# Patient Record
Sex: Male | Born: 1975 | Race: White | Hispanic: No | Marital: Married | Smoking: Former smoker
Health system: Southern US, Community
[De-identification: ages and names within clinical notes are randomized; demographics above are authoritative.]

## PROBLEM LIST (undated history)

## (undated) DIAGNOSIS — D649 Anemia, unspecified: Secondary | ICD-10-CM

## (undated) DIAGNOSIS — I10 Essential (primary) hypertension: Secondary | ICD-10-CM

## (undated) DIAGNOSIS — E785 Hyperlipidemia, unspecified: Secondary | ICD-10-CM

## (undated) DIAGNOSIS — S93409A Sprain of unspecified ligament of unspecified ankle, initial encounter: Secondary | ICD-10-CM

## (undated) HISTORY — PX: COLONOSCOPY: SHX174

## (undated) HISTORY — DX: Anemia, unspecified: D64.9

## (undated) HISTORY — PX: HERNIA REPAIR: SHX51

## (undated) HISTORY — DX: Essential (primary) hypertension: I10

## (undated) HISTORY — PX: OTHER SURGICAL HISTORY: SHX169

## (undated) HISTORY — DX: Hyperlipidemia, unspecified: E78.5

## (undated) HISTORY — DX: Sprain of unspecified ligament of unspecified ankle, initial encounter: S93.409A

---

## 2013-08-04 LAB — HM COLONOSCOPY

## 2014-08-07 ENCOUNTER — Ambulatory Visit (INDEPENDENT_AMBULATORY_CARE_PROVIDER_SITE_OTHER): Payer: 59 | Admitting: Family Medicine

## 2014-08-07 ENCOUNTER — Encounter: Payer: Self-pay | Admitting: Family Medicine

## 2014-08-07 VITALS — BP 148/92 | HR 60 | Temp 98.3°F | Resp 16 | Ht 73.75 in | Wt 217.8 lb

## 2014-08-07 DIAGNOSIS — R21 Rash and other nonspecific skin eruption: Secondary | ICD-10-CM

## 2014-08-07 DIAGNOSIS — E785 Hyperlipidemia, unspecified: Secondary | ICD-10-CM

## 2014-08-07 DIAGNOSIS — Z0000 Encounter for general adult medical examination without abnormal findings: Secondary | ICD-10-CM

## 2014-08-07 DIAGNOSIS — IMO0001 Reserved for inherently not codable concepts without codable children: Secondary | ICD-10-CM

## 2014-08-07 DIAGNOSIS — D649 Anemia, unspecified: Secondary | ICD-10-CM | POA: Diagnosis not present

## 2014-08-07 DIAGNOSIS — R030 Elevated blood-pressure reading, without diagnosis of hypertension: Secondary | ICD-10-CM | POA: Diagnosis not present

## 2014-08-07 MED ORDER — ATORVASTATIN CALCIUM 20 MG PO TABS: 20.0000 mg | ORAL_TABLET | Freq: Every day | ORAL | Status: DC

## 2014-08-07 MED ORDER — TRIAMCINOLONE ACETONIDE 0.1 % EX CREA: 1.0000 "application " | TOPICAL_CREAM | Freq: Two times a day (BID) | CUTANEOUS | Status: DC

## 2014-08-07 NOTE — Progress Notes (Signed)
   Subjective:    Patient ID: Cory Mcfarland, male    DOB: [birth year], 39 y.o.   MRN: [id number]  HPI    Review of Systems  Constitutional: Negative.   HENT: Negative.   Eyes: Negative.   Respiratory: Negative.   Cardiovascular: Negative.   Gastrointestinal: Negative.   Endocrine: Negative.   Genitourinary: Negative.   Musculoskeletal: Negative.   Skin: Positive for rash.  Allergic/Immunologic: Negative.   Neurological: Negative.   Hematological: Negative.   Psychiatric/Behavioral: Negative.        Objective:   Physical Exam        Assessment & Plan:

## 2014-08-07 NOTE — Progress Notes (Signed)
Subjective:    Patient ID: Cory Mcfarland, male    DOB: 07-01-1975, 39 y.o.   MRN: [id number] This chart was scribed for Meredith Staggers, MD by Littie Deeds, Medical Scribe. This patient was seen in Room 23 and the patient's care was started at 2:22 PM.    HPI HPI Comments: Cory Mcfarland is a 39 y.o. male with a history of hyperlipidemia who presents to the [facility] for a complete physical exam. He also has concern for a rash on the back of his head. He is a new patient to me. Takes Lipitor for hyperlipidemia.   Patient recently moved to the area from [place] about 6 months ago. He grew up in [place], [place]. He works for an Scientist, [employer].   Anemia: About 1 year ago, before coming here, he had been found to have anemia. He had some testing done which includes colonoscopy, endoscopy and PillCam capsule endoscopy. Patient was told to follow-up in 10 years. He was found to have chronic hemorrhoid for which he had a hemorrhoidectomy October of last year. He moved here about 2 weeks after the surgery. He has not had his blood count checked since then. He denies blood in stool. Patient states he has never felt any symptoms of anemia.  Rash: Patient has had a rash to the back of his scalp that was first noticed 2-3 weeks ago. He has tried Neutrogena T/Gel with some relief. He states he has had a similar rash to the hairline of his neck previously.  Elevated Blood Pressure: Patient does have elevated blood pressure in the office today at 148/92. He states his blood pressure tends to fluctuate and does not have concern for his blood pressure. Patient does not check his blood pressure at home. He denies history of hypertension.  Hyperlipidemia: Patient was diagnosed with hyperlipidemia about 4.5 years ago. He was initially started on 10 mg Lipitor and is now on 20 mg Lipitor. Patient smoked about 1 pack a week for 5-6 years, but quit smoking 12-13 years ago. FMHx includes  hyperlipidemia in his mother. His father died at age 35 for cancer that started in the kidneys and was a smoker.   Immunizations: Last Tdap is unknown. He will check with his previous doctor in [place] about his tetanus status. He does not typically get the flu shot and has had the flu shot only once.  There is no immunization history on file for this patient.   Depression Screening: Depression screen [facility] 2/9 08/07/2014 08/07/2014  Decreased Interest 0 0  Down, Depressed, Hopeless 0 0  PHQ - 2 Score 0 0   Exercise: Patient exercises 5 days a week. He denies lightheadedness and dizziness.  Dentist: Patient states he sees a Education officer, community regularly.  Vision: Vision testing was done without glasses, which he normally wears. He last had his vision tested 1 year ago.  Visual Acuity Screening   Right eye Left eye Both eyes  Without correction: 20/40 20/40 20/40   With correction:       STI Testing: Patient is married and has two girls of age 23 and 68. He does not need STI testing.  Review of Systems 13 point ROS reviewed on patient health survey. Negative other than listed above or in nursing note. See nursing note.      Objective:   Physical Exam  Constitutional: He is oriented to person, place, and time. He appears well-developed and well-nourished. No distress.  HENT:  Head: Normocephalic  and atraumatic.  Mouth/Throat: Oropharynx is clear and moist. No oropharyngeal exudate.  Eyes: Pupils are equal, round, and reactive to light.  Neck: Neck supple.  Cardiovascular: Normal rate.   Pulmonary/Chest: Effort normal.  Musculoskeletal: He exhibits no edema.  Neurological: He is alert and oriented to person, place, and time. No cranial nerve deficit.  Skin: Skin is warm and dry. Rash noted.  Multiple areas of erythema to the base of the hair follicles of the occiput of scalp which spreads posteriorly to the lower hairline of the posterior neck, few excoriated areas.  Psychiatric: He has a normal  mood and affect. His behavior is normal.  Vitals reviewed.    Filed Vitals:   08/07/14 1336  BP: 148/92  Pulse: 60  Temp: 98.3 F (36.8 C)  TempSrc: Oral  Resp: 16  Height: 6' 1.75" (1.873 m)  Weight: 217 lb 12.8 oz (98.793 kg)  SpO2: 96%       Assessment & Plan:   Cory Mcfarland is a 39 y.o. male Annual physical exam  --anticipatory guidance as below in AVS, screening labs above. Health maintenance items as above in HPI discussed/recommended as applicable.   Hyperlipidemia - Plan: atorvastatin (LIPITOR) 20 MG tablet, COMPLETE METABOLIC PANEL WITH GFR, Lipid panel  - Controlled by history.  Continue Lipitor 20 mg daily, lab orders for fasting labs in next week or 2, with CMP and lipid panel  Anemia, unspecified anemia type - Plan: CBC with Differential/Platelet  -By history, with normal GI workup including endoscopy, colonoscopy, and capsule eval.   -Repeat CBC.  Elevated BP  -Borderline elevated in office, will have him check his BP outside of office, and if over 140/90, return for recheck and possible treatment.  Rash and nonspecific skin eruption - Plan: triamcinolone cream (KENALOG) 0.1 %, Ambulatory referral to Dermatology  -Upper, posterior scalp.  Does not appear to be tinea.  DDX of seborrheic dermatitis, contact dermatitis.  Can try Kenalog to affected areas short-term if needed, but will refer to dermatology for eval, return to clinic precautions.   Meds ordered this encounter  Medications  . DISCONTD: atorvastatin (LIPITOR) 20 MG tablet    Sig: Take 20 mg by mouth daily.  Marland Kitchen. atorvastatin (LIPITOR) 20 MG tablet    Sig: Take 1 tablet (20 mg total) by mouth daily.    Dispense:  90 tablet    Refill:  1  . triamcinolone cream (KENALOG) 0.1 %    Sig: Apply 1 application topically 2 (two) times daily. As needed to affected areas.    Dispense:  30 g    Refill:  0   Patient Instructions  Keep a record of your blood pressures outside of the office and bring them to  the next office visit - if over 140/90 - return to discuss further.   Return for fasting blood work in next week. You should receive a call or letter about your lab results within the next week to 10 days.    I will refer you to dermatology.  You can try steroid cream to itching areas up to twice per day as needed for now.   Keeping you healthy  Get these tests  Blood pressure- Have your blood pressure checked once a year by your healthcare provider.  Normal blood pressure is 120/80.  Weight- Have your body mass index (BMI) calculated to screen for obesity.  BMI is a measure of body fat based on height and weight. You can also calculate your own BMI  at https://www.west-esparza.com/.  Cholesterol- Have your cholesterol checked regularly starting at age 84, sooner may be necessary if you have diabetes, high blood pressure, if a family member developed heart diseases at an early age or if you smoke.   Chlamydia, HIV, and other sexual transmitted disease- Get screened each year until the age of 86 then within three months of each new sexual partner.  Diabetes- Have your blood sugar checked regularly if you have high blood pressure, high cholesterol, a family history of diabetes or if you are overweight.  Get these vaccines  Flu shot- Every fall.  Tetanus shot- Every 10 years.  Menactra- Single dose; prevents meningitis.  Take these steps  Don't smoke- If you do smoke, ask your healthcare provider about quitting. For tips on how to quit, go to www.smokefree.gov or call 1-800-QUIT-NOW.  Be physically active- Exercise 5 days a week for at least 30 minutes.  If you are not already physically active start slow and gradually work up to 30 minutes of moderate physical activity.  Examples of moderate activity include walking briskly, mowing the yard, dancing, swimming bicycling, etc.  Eat a healthy diet- Eat a variety of healthy foods such as fruits, vegetables, low fat milk, low fat cheese,  yogurt, lean meats, poultry, fish, beans, tofu, etc.  For more information on healthy eating, go to www.thenutritionsource.org  Drink alcohol in moderation- Limit alcohol intake two drinks or less a day.  Never drink and drive.  Dentist- Brush and floss teeth twice daily; visit your dentis twice a year.  Depression-Your emotional health is as important as your physical health.  If you're feeling down, losing interest in things you normally enjoy please talk with your healthcare provider.  Gun Safety- If you keep a gun in your home, keep it unloaded and with the safety lock on.  Bullets should be stored separately.  Helmet use- Always wear a helmet when riding a motorcycle, bicycle, rollerblading or skateboarding.  Safe sex- If you may be exposed to a sexually transmitted infection, use a condom  Seat belts- Seat bels can save your life; always wear one.  Smoke/Carbon Monoxide detectors- These detectors need to be installed on the appropriate level of your home.  Replace batteries at least once a year.  Skin Cancer- When out in the sun, cover up and use sunscreen SPF 15 or higher.  Violence- If anyone is threatening or hurting you, please tell your healthcare provider.    I personally performed the services described in this documentation, which was scribed in my presence. The recorded information has been reviewed and considered, and addended by me as needed.

## 2014-08-07 NOTE — Patient Instructions (Addendum)
Keep a record of your blood pressures outside of the office and bring them to the next office visit - if over 140/90 - return to discuss further.   Return for fasting blood work in next week. You should receive a call or letter about your lab results within the next week to 10 days.    I will refer you to dermatology.  You can try steroid cream to itching areas up to twice per day as needed for now.   Keeping you healthy  Get these tests  Blood pressure- Have your blood pressure checked once a year by your healthcare provider.  Normal blood pressure is 120/80.  Weight- Have your body mass index (BMI) calculated to screen for obesity.  BMI is a measure of body fat based on height and weight. You can also calculate your own BMI at https://www.west-esparza.com/www.nhlbisupport.com/bmi/.  Cholesterol- Have your cholesterol checked regularly starting at age 39, sooner may be necessary if you have diabetes, high blood pressure, if a family member developed heart diseases at an early age or if you smoke.   Chlamydia, HIV, and other sexual transmitted disease- Get screened each year until the age of 39 then within three months of each new sexual partner.  Diabetes- Have your blood sugar checked regularly if you have high blood pressure, high cholesterol, a family history of diabetes or if you are overweight.  Get these vaccines  Flu shot- Every fall.  Tetanus shot- Every 10 years.  Menactra- Single dose; prevents meningitis.  Take these steps  Don't smoke- If you do smoke, ask your healthcare provider about quitting. For tips on how to quit, go to www.smokefree.gov or call 1-800-QUIT-NOW.  Be physically active- Exercise 5 days a week for at least 30 minutes.  If you are not already physically active start slow and gradually work up to 30 minutes of moderate physical activity.  Examples of moderate activity include walking briskly, mowing the yard, dancing, swimming bicycling, etc.  Eat a healthy diet- Eat a variety of  healthy foods such as fruits, vegetables, low fat milk, low fat cheese, yogurt, lean meats, poultry, fish, beans, tofu, etc.  For more information on healthy eating, go to www.thenutritionsource.org  Drink alcohol in moderation- Limit alcohol intake two drinks or less a day.  Never drink and drive.  Dentist- Brush and floss teeth twice daily; visit your dentis twice a year.  Depression-Your emotional health is as important as your physical health.  If you're feeling down, losing interest in things you normally enjoy please talk with your healthcare provider.  Gun Safety- If you keep a gun in your home, keep it unloaded and with the safety lock on.  Bullets should be stored separately.  Helmet use- Always wear a helmet when riding a motorcycle, bicycle, rollerblading or skateboarding.  Safe sex- If you may be exposed to a sexually transmitted infection, use a condom  Seat belts- Seat bels can save your life; always wear one.  Smoke/Carbon Monoxide detectors- These detectors need to be installed on the appropriate level of your home.  Replace batteries at least once a year.  Skin Cancer- When out in the sun, cover up and use sunscreen SPF 15 or higher.  Violence- If anyone is threatening or hurting you, please tell your healthcare provider.

## 2014-08-15 ENCOUNTER — Other Ambulatory Visit (INDEPENDENT_AMBULATORY_CARE_PROVIDER_SITE_OTHER): Payer: 59

## 2014-08-15 DIAGNOSIS — E785 Hyperlipidemia, unspecified: Secondary | ICD-10-CM

## 2014-08-15 DIAGNOSIS — D649 Anemia, unspecified: Secondary | ICD-10-CM | POA: Diagnosis not present

## 2014-08-15 LAB — CBC WITH DIFFERENTIAL/PLATELET
Basophils Absolute: 0 10*3/uL (ref 0.0–0.1)
Basophils Relative: 0 % (ref 0–1)
EOS PCT: 6 % — AB (ref 0–5)
Eosinophils Absolute: 0.2 10*3/uL (ref 0.0–0.7)
HCT: 42 % (ref 39.0–52.0)
HEMOGLOBIN: 13.7 g/dL (ref 13.0–17.0)
LYMPHS ABS: 1.2 10*3/uL (ref 0.7–4.0)
LYMPHS PCT: 35 % (ref 12–46)
MCH: 25.9 pg — ABNORMAL LOW (ref 26.0–34.0)
MCHC: 32.6 g/dL (ref 30.0–36.0)
MCV: 79.4 fL (ref 78.0–100.0)
MPV: 10.4 fL (ref 8.6–12.4)
Monocytes Absolute: 0.4 10*3/uL (ref 0.1–1.0)
Monocytes Relative: 12 % (ref 3–12)
NEUTROS ABS: 1.6 10*3/uL — AB (ref 1.7–7.7)
Neutrophils Relative %: 47 % (ref 43–77)
PLATELETS: 246 10*3/uL (ref 150–400)
RBC: 5.29 MIL/uL (ref 4.22–5.81)
RDW: 14.3 % (ref 11.5–15.5)
WBC: 3.4 10*3/uL — ABNORMAL LOW (ref 4.0–10.5)

## 2014-08-15 LAB — COMPLETE METABOLIC PANEL WITH GFR
ALBUMIN: 4.2 g/dL (ref 3.5–5.2)
ALT: 22 U/L (ref 0–53)
AST: 20 U/L (ref 0–37)
Alkaline Phosphatase: 46 U/L (ref 39–117)
BUN: 14 mg/dL (ref 6–23)
CHLORIDE: 106 meq/L (ref 96–112)
CO2: 28 mEq/L (ref 19–32)
Calcium: 9.7 mg/dL (ref 8.4–10.5)
Creat: 0.83 mg/dL (ref 0.50–1.35)
GFR, Est Non African American: >89 mL/min
Glucose, Bld: 95 mg/dL (ref 70–99)
Potassium: 4.4 mEq/L (ref 3.5–5.3)
Sodium: 139 mEq/L (ref 135–145)
TOTAL PROTEIN: 7.3 g/dL (ref 6.0–8.3)
Total Bilirubin: 0.6 mg/dL (ref 0.2–1.2)

## 2014-08-15 LAB — LIPID PANEL
CHOL/HDL RATIO: 4.3 ratio
Cholesterol: 162 mg/dL (ref 0–200)
HDL: 38 mg/dL — ABNORMAL LOW (ref >=40)
LDL CALC: 84 mg/dL (ref 0–99)
Triglycerides: 202 mg/dL — ABNORMAL HIGH (ref <150)
VLDL: 40 mg/dL (ref 0–40)

## 2014-08-15 NOTE — Progress Notes (Signed)
Pt is here for lab work only. 

## 2015-02-05 ENCOUNTER — Ambulatory Visit: Payer: 59 | Admitting: Family Medicine

## 2015-03-29 ENCOUNTER — Telehealth: Payer: Self-pay

## 2015-03-29 DIAGNOSIS — E785 Hyperlipidemia, unspecified: Secondary | ICD-10-CM

## 2015-03-29 DIAGNOSIS — D649 Anemia, unspecified: Secondary | ICD-10-CM

## 2015-03-29 NOTE — Telephone Encounter (Signed)
Pt missed his 6 month follow up appointment 1/17. He is scheduled for another appt 04/11/15 but he wants to go ahead and have the lab work done ahead of time, like coming into the walk in clinic for that. He states that he prefers Dr. Neva Seat have his lab work back before the appointment 3/15 that's why he doesn't want to have them done on the day of his scheduled appointment. So if there needs to be adjustments to his medication it can be done at that time. Pt would like an order place for labs to be done please. Call patient with details.

## 2015-03-29 NOTE — Telephone Encounter (Deleted)
Pt missed his 6 month follow up appt. He wants to come in and have labs done but isd s

## 2015-03-30 NOTE — Telephone Encounter (Signed)
Future lab orders placed. Should be fasting for these. Can have done prior to his visit. Let me know if other questions.

## 2015-04-05 ENCOUNTER — Other Ambulatory Visit (INDEPENDENT_AMBULATORY_CARE_PROVIDER_SITE_OTHER): Payer: Managed Care, Other (non HMO)

## 2015-04-05 DIAGNOSIS — E785 Hyperlipidemia, unspecified: Secondary | ICD-10-CM | POA: Diagnosis not present

## 2015-04-05 DIAGNOSIS — D649 Anemia, unspecified: Secondary | ICD-10-CM

## 2015-04-05 LAB — COMPLETE METABOLIC PANEL WITH GFR
ALT: 37 U/L (ref 9–46)
AST: 24 U/L (ref 10–40)
Albumin: 4.3 g/dL (ref 3.6–5.1)
Alkaline Phosphatase: 41 U/L (ref 40–115)
BILIRUBIN TOTAL: 0.5 mg/dL (ref 0.2–1.2)
BUN: 12 mg/dL (ref 7–25)
CHLORIDE: 106 mmol/L (ref 98–110)
CO2: 24 mmol/L (ref 20–31)
Calcium: 9.4 mg/dL (ref 8.6–10.3)
Creat: 0.94 mg/dL (ref 0.60–1.35)
GLUCOSE: 94 mg/dL (ref 65–99)
Potassium: 4.8 mmol/L (ref 3.5–5.3)
SODIUM: 139 mmol/L (ref 135–146)
TOTAL PROTEIN: 7.2 g/dL (ref 6.1–8.1)

## 2015-04-05 LAB — CBC
HCT: 41.4 % (ref 39.0–52.0)
HEMOGLOBIN: 13.6 g/dL (ref 13.0–17.0)
MCH: 26.2 pg (ref 26.0–34.0)
MCHC: 32.9 g/dL (ref 30.0–36.0)
MCV: 79.6 fL (ref 78.0–100.0)
MPV: 10 fL (ref 8.6–12.4)
PLATELETS: 242 10*3/uL (ref 150–400)
RBC: 5.2 MIL/uL (ref 4.22–5.81)
RDW: 14.1 % (ref 11.5–15.5)
WBC: 4.6 10*3/uL (ref 4.0–10.5)

## 2015-04-05 LAB — LIPID PANEL
CHOL/HDL RATIO: 4.6 ratio (ref <=5.0)
Cholesterol: 173 mg/dL (ref 125–200)
HDL: 38 mg/dL — AB (ref >=40)
LDL CALC: 92 mg/dL (ref <130)
TRIGLYCERIDES: 215 mg/dL — AB (ref <150)
VLDL: 43 mg/dL — AB (ref <30)

## 2015-04-11 ENCOUNTER — Encounter: Payer: Self-pay | Admitting: Family Medicine

## 2015-04-11 ENCOUNTER — Ambulatory Visit (INDEPENDENT_AMBULATORY_CARE_PROVIDER_SITE_OTHER): Payer: Managed Care, Other (non HMO) | Admitting: Family Medicine

## 2015-04-11 VITALS — BP 130/80 | HR 67 | Temp 98.1°F | Resp 16 | Ht 73.5 in | Wt 220.0 lb

## 2015-04-11 DIAGNOSIS — R21 Rash and other nonspecific skin eruption: Secondary | ICD-10-CM | POA: Diagnosis not present

## 2015-04-11 DIAGNOSIS — E785 Hyperlipidemia, unspecified: Secondary | ICD-10-CM | POA: Diagnosis not present

## 2015-04-11 DIAGNOSIS — M79671 Pain in right foot: Secondary | ICD-10-CM

## 2015-04-11 DIAGNOSIS — M79672 Pain in left foot: Secondary | ICD-10-CM | POA: Diagnosis not present

## 2015-04-11 DIAGNOSIS — Z309 Encounter for contraceptive management, unspecified: Secondary | ICD-10-CM | POA: Diagnosis not present

## 2015-04-11 DIAGNOSIS — Z3009 Encounter for other general counseling and advice on contraception: Secondary | ICD-10-CM

## 2015-04-11 MED ORDER — ATORVASTATIN CALCIUM 20 MG PO TABS: 20.0000 mg | ORAL_TABLET | Freq: Every day | ORAL | Status: DC

## 2015-04-11 NOTE — Patient Instructions (Signed)
Blood pressure ok today. Ok to continue to monitor out of office and if remaining elevated over 140/90 - let me know.   Ok to remain on same dose of Lipitor for now, but follow up later this year for a physical. Can recheck at that time to determine if change needed.   Make sure laces and shoewear  are not too tight as this can cause symptoms on top of feet. If these symptoms worsen/return - recheck.

## 2015-04-11 NOTE — Progress Notes (Signed)
Subjective:  By signing my name below, I, Cory Mcfarland, attest that this documentation has been prepared under the direction and in the presence of Cory StaggersJeffrey Tokiko Diefenderfer, MD.  Electronically Signed: Andrew Auaven Mcfarland, ED Scribe. 04/11/2015. 2:32 PM.  Patient ID: Cory Mcfarland, male    DOB: [birth year], 40 y.o.   MRN: [id number]  HPI   Chief Complaint  Patient presents with  . Follow-up    6 mos  . elevated BP  . Hyperlipidemia  . pain on top pt both feet x 2 wks    "thinks it's from running'    HPI Comments: Cory Mcfarland Ciolino is a 40 y.o. male who presents to the [facility] for a follow up. Last visit with me 07/2014. Here with multiple concerns today.   Concern of elevated BP Pt checks his BP at his gym. He states BP fluctuates from 125/94-144/84 range.   Hyperlipidemia He is on Lipitor 20mg  qd. He denies side effects with medication.   Lab Results  Component Value Date   CHOL 173 04/05/2015   HDL 38* 04/05/2015   LDLCALC 92 04/05/2015   TRIG 215* 04/05/2015   CHOLHDL 4.6 04/05/2015   Lab Results  Component Value Date   ALT 37 04/05/2015   AST 24 04/05/2015   ALKPHOS 41 04/05/2015   BILITOT 0.5 04/05/2015   Recent lipid panel last week. Overall stable except elevated triglycerides.  Pt does not indulge in fast foods. He states him and his family have a well balance diet.  He reports family hx of HLD  Hx of anemia Normal CBC recently.   Foot pain Pt states he developed a sharp pain to the dorsum of bilateral feet that began about 2 weeks ago. pt states he had started running more. He noticed pain with putting on his shoes last week. No pain with walking or bearing weight. He denies wearing new shoes. He states pain has subsided. He denies numbness and tingling in feet.   Urology referral  Pt would like a referral to urologist for a vasectomy evaluation.     There are no active problems to display for this patient.  Past Medical History  Diagnosis Date  .  Anemia   . Hyperlipidemia    Past Surgical History  Procedure Laterality Date  . Hernia repair     No Known Allergies Prior to Admission medications   Medication Sig Start Date End Date Taking? Authorizing Provider  atorvastatin (LIPITOR) 20 MG tablet Take 1 tablet (20 mg total) by mouth daily. 08/07/14  Yes Shade FloodJeffrey R Nashaun Hillmer, MD  triamcinolone cream (KENALOG) 0.1 % Apply 1 application topically 2 (two) times daily. As needed to affected areas. 08/07/14  Yes Shade FloodJeffrey R Adreanna Fickel, MD   Social History   Social History  . Marital Status: Married    Spouse Name: N/A  . Number of Children: N/A  . Years of Education: N/A   Occupational History  . Not on file.   Social History Main Topics  . Smoking status: Former Games developermoker  . Smokeless tobacco: Not on file     Comment: quit 12 years ago  . Alcohol Use: 4.8 oz/week    8 Glasses of wine per week  . Drug Use: No  . Sexual Activity: Yes   Other Topics Concern  . Not on file   Social History Narrative  . No narrative on file    Review of Systems  Musculoskeletal: Negative for gait problem.  Neurological: Negative for weakness and  numbness.   Objective:   Physical Exam  Constitutional: He is oriented to person, place, and time. He appears well-developed and well-nourished. No distress.  HENT:  Head: Normocephalic and atraumatic.  Eyes: Conjunctivae and EOM are normal.  Neck: Neck supple.  Cardiovascular: Normal rate, regular rhythm and normal heart sounds.   No murmur heard. Pulses:      Dorsalis pedis pulses are 2+ on the right side, and 2+ on the left side.  Pulmonary/Chest: Effort normal and breath sounds normal. He has no wheezes. He has no rhonchi. He has no rales.  Musculoskeletal: Normal range of motion. He exhibits no edema.  Negative tinel's over ankle. Full ROM of ankle and feet.   Neurological: He is alert and oriented to person, place, and time.  Skin: Skin is warm and dry.  Skin is intact.   Psychiatric: He has a  normal mood and affect. His behavior is normal.  Nursing note and vitals reviewed.   Filed Vitals:   04/11/15 1413  BP: 130/80  Pulse: 67  Temp: 98.1 F (36.7 C)  TempSrc: Oral  Resp: 16  Height: 6' 1.5" (1.867 m)  Weight: 220 lb (99.791 kg)  SpO2: 98%   Wt Readings from Last 3 Encounters:  04/11/15 220 lb (99.791 kg)  08/07/14 217 lb 12.8 oz (98.793 kg)    Assessment & Plan:   Brek Reece is a 40 y.o. male Vasectomy evaluation - Plan: Ambulatory referral to Urology  - Refer to urology.  Hyperlipidemia - Plan: atorvastatin (LIPITOR) 20 MG tablet  - Elevated triglycerides, but other lipids okay. Decided to continue same dose Lipitor, follow-up for physical later this year and recheck lipids then. Watch diet, and continue exercise.  Foot pain, bilateral  -intermittent, possible extensor tendinitis versus impingement of dorsal nerve in the foot with tight shoelaces. Improved now, discussed avoiding tight laces across top of foot, and if persistent, return for recheck.  Rash and nonspecific skin eruption  -Suspected folliculitis per day, as symptoms on posterior neck after her cut specifically. Discuss course brush or washcloth after haircut and showering, Triamcinolone cream refilled if needed.  Meds ordered this encounter  Medications  . atorvastatin (LIPITOR) 20 MG tablet    Sig: Take 1 tablet (20 mg total) by mouth daily.    Dispense:  90 tablet    Refill:  2   Patient Instructions  Blood pressure ok today. Ok to continue to monitor out of office and if remaining elevated over 140/90 - let me know.   Ok to remain on same dose of Lipitor for now, but follow up later this year for a physical. Can recheck at that time to determine if change needed.   Make sure laces and shoewear  are not too tight as this can cause symptoms on top of feet. If these symptoms worsen/return - recheck.          I personally performed the services described in this documentation,  which was scribed in my presence. The recorded information has been reviewed and considered, and addended by me as needed.

## 2015-07-02 ENCOUNTER — Ambulatory Visit (INDEPENDENT_AMBULATORY_CARE_PROVIDER_SITE_OTHER): Payer: Managed Care, Other (non HMO) | Admitting: Family Medicine

## 2015-07-02 VITALS — BP 140/82 | HR 60 | Temp 97.9°F | Resp 18 | Ht 73.5 in | Wt 223.0 lb

## 2015-07-02 DIAGNOSIS — M1000 Idiopathic gout, unspecified site: Secondary | ICD-10-CM | POA: Diagnosis not present

## 2015-07-02 DIAGNOSIS — M109 Gout, unspecified: Secondary | ICD-10-CM

## 2015-07-02 MED ORDER — INDOMETHACIN 50 MG PO CAPS: 50.0000 mg | ORAL_CAPSULE | Freq: Three times a day (TID) | ORAL | Status: DC | PRN

## 2015-07-02 NOTE — Patient Instructions (Addendum)
IF you received an x-ray today, you will receive an invoice from [facility]. Please contact [facility] at [phone] with questions or concerns regarding your invoice.   IF you received labwork today, you will receive an invoice from [facility]. Please contact [facility] at [phone] with questions or concerns regarding your invoice.   Our billing staff will not be able to assist you with questions regarding bills from these companies.  You will be contacted with the lab results as soon as they are available. The fastest way to get your results is to activate your My Chart account. Instructions are located on the last page of this paperwork. If you have not heard from us regarding the results in 2 weeks, please contact this office.    Your foot pain appears to be due to gout. This is the typical way it starts. As this is the first episode, can treat with indomethacin up to 3 times per day as needed. Make sure you take this with food. Do not combine with other NSAIDs. If symptoms return, would do other testing such as a uric acid level and can discuss other medications for treatment.  Return to the clinic or go to the nearest emergency room if any of your symptoms worsen or new symptoms occur.  Gout Gout is an inflammatory arthritis caused by a buildup of uric acid crystals in the joints. Uric acid is a chemical that is normally present in the blood. When the level of uric acid in the blood is too high it can form crystals that deposit in your joints and tissues. This causes joint redness, soreness, and swelling (inflammation). Repeat attacks are common. Over time, uric acid crystals can form into masses (tophi) near a joint, destroying bone and causing disfigurement. Gout is treatable and often preventable. CAUSES  The disease begins with elevated levels of uric acid in the blood. Uric acid is produced by your body when it breaks down a  naturally found substance called purines. Certain foods you eat, such as meats and fish, contain high amounts of purines. Causes of an elevated uric acid level include:  Being passed down from parent to child (heredity).  Diseases that cause increased uric acid production (such as obesity, psoriasis, and certain cancers).  Excessive alcohol use.  Diet, especially diets rich in meat and seafood.  Medicines, including certain cancer-fighting medicines (chemotherapy), water pills (diuretics), and aspirin.  Chronic kidney disease. The kidneys are no longer able to remove uric acid well.  Problems with metabolism. Conditions strongly associated with gout include:  Obesity.  High blood pressure.  High cholesterol.  Diabetes. Not everyone with elevated uric acid levels gets gout. It is not understood why some people get gout and others do not. Surgery, joint injury, and eating too much of certain foods are some of the factors that can lead to gout attacks. SYMPTOMS   An attack of gout comes on quickly. It causes intense pain with redness, swelling, and warmth in a joint.  Fever can occur.  Often, only one joint is involved. Certain joints are more commonly involved:  Base of the big toe.  Knee.  Ankle.  Wrist.  Finger. Without treatment, an attack usually goes away in a few days to weeks. Between attacks, you usually will not have symptoms, which is different from many other forms of arthritis. DIAGNOSIS  Your caregiver will suspect gout based on your symptoms and exam. In some cases, tests may be recommended.  The tests may include:  Blood tests.  Urine tests.  X-rays.  Joint fluid exam. This exam requires a needle to remove fluid from the joint (arthrocentesis). Using a microscope, gout is confirmed when uric acid crystals are seen in the joint fluid. TREATMENT  There are two phases to gout treatment: treating the sudden onset (acute) attack and preventing attacks  (prophylaxis).  Treatment of an Acute Attack.  Medicines are used. These include anti-inflammatory medicines or steroid medicines.  An injection of steroid medicine into the affected joint is sometimes necessary.  The painful joint is rested. Movement can worsen the arthritis.  You may use warm or cold treatments on painful joints, depending which works best for you.  Treatment to Prevent Attacks.  If you suffer from frequent gout attacks, your caregiver may advise preventive medicine. These medicines are started after the acute attack subsides. These medicines either help your kidneys eliminate uric acid from your body or decrease your uric acid production. You may need to stay on these medicines for a very long time.  The early phase of treatment with preventive medicine can be associated with an increase in acute gout attacks. For this reason, during the first few months of treatment, your caregiver may also advise you to take medicines usually used for acute gout treatment. Be sure you understand your caregiver's directions. Your caregiver may make several adjustments to your medicine dose before these medicines are effective.  Discuss dietary treatment with your caregiver or dietitian. Alcohol and drinks high in sugar and fructose and foods such as meat, poultry, and seafood can increase uric acid levels. Your caregiver or dietitian can advise you on drinks and foods that should be limited. HOME CARE INSTRUCTIONS   Do not take aspirin to relieve pain. This raises uric acid levels.  Only take over-the-counter or prescription medicines for pain, discomfort, or fever as directed by your caregiver.  Rest the joint as much as possible. When in bed, keep sheets and blankets off painful areas.  Keep the affected joint raised (elevated).  Apply warm or cold treatments to painful joints. Use of warm or cold treatments depends on which works best for you.  Use crutches if the painful joint  is in your leg.  Drink enough fluids to keep your urine clear or pale yellow. This helps your body get rid of uric acid. Limit alcohol, sugary drinks, and fructose drinks.  Follow your dietary instructions. Pay careful attention to the amount of protein you eat. Your daily diet should emphasize fruits, vegetables, whole grains, and fat-free or low-fat milk products. Discuss the use of coffee, vitamin C, and cherries with your caregiver or dietitian. These may be helpful in lowering uric acid levels.  Maintain a healthy body weight. SEEK MEDICAL CARE IF:   You develop diarrhea, vomiting, or any side effects from medicines.  You do not feel better in 24 hours, or you are getting worse. SEEK IMMEDIATE MEDICAL CARE IF:   Your joint becomes suddenly more tender, and you have chills or a fever. MAKE SURE YOU:   Understand these instructions.  Will watch your condition.  Will get help right away if you are not doing well or get worse.   This information is not intended to replace advice given to you by your health care provider. Make sure you discuss any questions you have with your health care provider.   Document Released: 01/11/2000 Document Revised: 02/03/2014 Document Reviewed: 08/27/2011 Elsevier Interactive Patient Education [employer]

## 2015-07-02 NOTE — Progress Notes (Signed)
Subjective:  By signing my name below, I, [name], attest that this documentation has been prepared under the direction and in the presence of Meredith Staggers, MD.  Electronically Signed: Andrew Au, ED Scribe. 07/02/2015. 2:11 PM.   Patient ID: Cory Mcfarland, male    DOB: 01/01/1976, 40 y.o.   MRN: [id number]  HPI Chief Complaint  Patient presents with  . Foot Pain    right foot poss gout    HPI Comments: Cory Mcfarland is a 40 y.o. male who presents to the [facility] complaining of right foot pain . Of note he had dorsal foot pain thought to be due to running at march visit. Thought to be dorsal nerve impingement. Advised to loosen laces.   Pt reports sudden right foot pain began last night at 8pm. He denies right foot injury. He locates pain to dorsum of right foot at base of 1st digit.  He reports pain with bearing weight to right foot. He has taken advil with improvement to pain. He denies hx of gout. He denies change in diet including an increase in seafood's or red meat. He does not hanging out with friends drinks alcohol occasionally. .  There are no active problems to display for this patient.  Past Medical History  Diagnosis Date  . Anemia   . Hyperlipidemia    Past Surgical History  Procedure Laterality Date  . Hernia repair     No Known Allergies Prior to Admission medications   Medication Sig Start Date End Date Taking? Authorizing Provider  atorvastatin (LIPITOR) 20 MG tablet Take 1 tablet (20 mg total) by mouth daily. 04/11/15  Yes Shade Flood, MD   Social History   Social History  . Marital Status: Married    Spouse Name: N/A  . Number of Children: N/A  . Years of Education: N/A   Occupational History  . Not on file.   Social History Main Topics  . Smoking status: Former Games developer  . Smokeless tobacco: Not on file     Comment: quit 12 years ago  . Alcohol Use: 4.8 oz/week    8 Glasses of wine per week  . Drug Use: No  . Sexual  Activity: Yes   Other Topics Concern  . Not on file   Social History Narrative   Review of Systems  Constitutional: Negative for fever and chills.  Musculoskeletal: Positive for arthralgias. Negative for myalgias and gait problem.  Skin: Positive for color change. Negative for wound.  Neurological: Negative for weakness and numbness.     Objective:   Physical Exam  Constitutional: He is oriented to person, place, and time. He appears well-developed and well-nourished. No distress.  HENT:  Head: Normocephalic and atraumatic.  Eyes: Conjunctivae and EOM are normal.  Neck: Neck supple.  Cardiovascular: Normal rate.   Pulmonary/Chest: Effort normal.  Musculoskeletal: Normal range of motion.  Right first MTP is warm with slight erythema  Great toe non tender. Remainder of foot nontender including navicula and 5th MT. Skin intact no wounds.   Neurological: He is alert and oriented to person, place, and time.  Skin: Skin is warm and dry.  Psychiatric: He has a normal mood and affect. His behavior is normal.  Nursing note and vitals reviewed.  Filed Vitals:   07/02/15 1207  BP: 140/82  Pulse: 60  Temp: 97.9 F (36.6 C)  TempSrc: Oral  Resp: 18  Height: 6' 1.5" (1.867 m)  Weight: 223 lb (101.152 kg)  SpO2: 98%    Assessment & Plan:   Cory Mcfarland is a 40 y.o. male Podagra - Plan: indomethacin (INDOCIN) 50 MG capsule  Suspected initial gout flare. No wounds, no other concerning findings on exam. As initial symptoms, and likely due to recent diet change, we'll hold off on uric acid testing or prophylactic medications for now. Can treat with indomethacin 50 mg 3 times a day with food, avoid other NSAIDs, and RTC precautions, especially if recurrence.  Meds ordered this encounter  Medications  . indomethacin (INDOCIN) 50 MG capsule    Sig: Take 1 capsule (50 mg total) by mouth 3 (three) times daily as needed. With food for gout flare    Dispense:  30 capsule    Refill:  0    Patient Instructions       IF you received an x-ray today, you will receive an invoice from [facility]. Please contact [facility] at [phone] with questions or concerns regarding your invoice.   IF you received labwork today, you will receive an invoice from [facility]. Please contact [facility] at [phone] with questions or concerns regarding your invoice.   Our billing staff will not be able to assist you with questions regarding bills from these companies.  You will be contacted with the lab results as soon as they are available. The fastest way to get your results is to activate your My Chart account. Instructions are located on the last page of this paperwork. If you have not heard from [place] regarding the results in 2 weeks, please contact this office.    Your foot pain appears to be due to gout. This is the typical way it starts. As this is the first episode, can treat with indomethacin up to 3 times per day as needed. Make sure you take this with food. Do not combine with other NSAIDs. If symptoms return, would do other testing such as a uric acid level and can discuss other medications for treatment.  Return to the clinic or go to the nearest emergency room if any of your symptoms worsen or new symptoms occur.  Gout Gout is an inflammatory arthritis caused by a buildup of uric acid crystals in the joints. Uric acid is a chemical that is normally present in the blood. When the level of uric acid in the blood is too high it can form crystals that deposit in your joints and tissues. This causes joint redness, soreness, and swelling (inflammation). Repeat attacks are common. Over time, uric acid crystals can form into masses (tophi) near a joint, destroying bone and causing disfigurement. Gout is treatable and often preventable. CAUSES  The disease begins with elevated levels of uric acid in the blood. Uric acid is produced by your body  when it breaks down a naturally found substance called purines. Certain foods you eat, such as meats and fish, contain high amounts of purines. Causes of an elevated uric acid level include:  Being passed down from parent to child (heredity).  Diseases that cause increased uric acid production (such as obesity, psoriasis, and certain cancers).  Excessive alcohol use.  Diet, especially diets rich in meat and seafood.  Medicines, including certain cancer-fighting medicines (chemotherapy), water pills (diuretics), and aspirin.  Chronic kidney disease. The kidneys are no longer able to remove uric acid well.  Problems with metabolism. Conditions strongly associated with gout include:  Obesity.  High blood pressure.  High cholesterol.  Diabetes. Not everyone with elevated uric acid levels  gets gout. It is not understood why some people get gout and others do not. Surgery, joint injury, and eating too much of certain foods are some of the factors that can lead to gout attacks. SYMPTOMS   An attack of gout comes on quickly. It causes intense pain with redness, swelling, and warmth in a joint.  Fever can occur.  Often, only one joint is involved. Certain joints are more commonly involved:  Base of the big toe.  Knee.  Ankle.  Wrist.  Finger. Without treatment, an attack usually goes away in a few days to weeks. Between attacks, you usually will not have symptoms, which is different from many other forms of arthritis. DIAGNOSIS  Your caregiver will suspect gout based on your symptoms and exam. In some cases, tests may be recommended. The tests may include:  Blood tests.  Urine tests.  X-rays.  Joint fluid exam. This exam requires a needle to remove fluid from the joint (arthrocentesis). Using a microscope, gout is confirmed when uric acid crystals are seen in the joint fluid. TREATMENT  There are two phases to gout treatment: treating the sudden onset (acute) attack and  preventing attacks (prophylaxis).  Treatment of an Acute Attack.  Medicines are used. These include anti-inflammatory medicines or steroid medicines.  An injection of steroid medicine into the affected joint is sometimes necessary.  The painful joint is rested. Movement can worsen the arthritis.  You may use warm or cold treatments on painful joints, depending which works best for you.  Treatment to Prevent Attacks.  If you suffer from frequent gout attacks, your caregiver may advise preventive medicine. These medicines are started after the acute attack subsides. These medicines either help your kidneys eliminate uric acid from your body or decrease your uric acid production. You may need to stay on these medicines for a very long time.  The early phase of treatment with preventive medicine can be associated with an increase in acute gout attacks. For this reason, during the first few months of treatment, your caregiver may also advise you to take medicines usually used for acute gout treatment. Be sure you understand your caregiver's directions. Your caregiver may make several adjustments to your medicine dose before these medicines are effective.  Discuss dietary treatment with your caregiver or dietitian. Alcohol and drinks high in sugar and fructose and foods such as meat, poultry, and seafood can increase uric acid levels. Your caregiver or dietitian can advise you on drinks and foods that should be limited. HOME CARE INSTRUCTIONS   Do not take aspirin to relieve pain. This raises uric acid levels.  Only take over-the-counter or prescription medicines for pain, discomfort, or fever as directed by your caregiver.  Rest the joint as much as possible. When in bed, keep sheets and blankets off painful areas.  Keep the affected joint raised (elevated).  Apply warm or cold treatments to painful joints. Use of warm or cold treatments depends on which works best for you.  Use crutches if  the painful joint is in your leg.  Drink enough fluids to keep your urine clear or pale yellow. This helps your body get rid of uric acid. Limit alcohol, sugary drinks, and fructose drinks.  Follow your dietary instructions. Pay careful attention to the amount of protein you eat. Your daily diet should emphasize fruits, vegetables, whole grains, and fat-free or low-fat milk products. Discuss the use of coffee, vitamin C, and cherries with your caregiver or dietitian. These may be helpful  in lowering uric acid levels.  Maintain a healthy body weight. SEEK MEDICAL CARE IF:   You develop diarrhea, vomiting, or any side effects from medicines.  You do not feel better in 24 hours, or you are getting worse. SEEK IMMEDIATE MEDICAL CARE IF:   Your joint becomes suddenly more tender, and you have chills or a fever. MAKE SURE YOU:   Understand these instructions.  Will watch your condition.  Will get help right away if you are not doing well or get worse.   This information is not intended to replace advice given to you by your health care provider. Make sure you discuss any questions you have with your health care provider.   Document Released: 01/11/2000 Document Revised: 02/03/2014 Document Reviewed: 08/27/2011 Elsevier Interactive Patient Education [employer]     I personally performed the services described in this documentation, which was scribed in my presence. The recorded information has been reviewed and considered, and addended by me as needed.   Signed,   Meredith StaggersJeffrey Willian Donson, MD [facility].  07/02/2015 2:33 PM

## 2015-12-18 ENCOUNTER — Ambulatory Visit: Payer: Managed Care, Other (non HMO)

## 2015-12-19 ENCOUNTER — Other Ambulatory Visit: Payer: Self-pay | Admitting: Family Medicine

## 2015-12-19 DIAGNOSIS — E785 Hyperlipidemia, unspecified: Secondary | ICD-10-CM

## 2015-12-22 NOTE — Telephone Encounter (Signed)
06/2015 last ov 03/2015 last labs

## 2016-01-09 ENCOUNTER — Ambulatory Visit (INDEPENDENT_AMBULATORY_CARE_PROVIDER_SITE_OTHER): Payer: Managed Care, Other (non HMO) | Admitting: Family Medicine

## 2016-01-09 ENCOUNTER — Ambulatory Visit (INDEPENDENT_AMBULATORY_CARE_PROVIDER_SITE_OTHER): Payer: Managed Care, Other (non HMO)

## 2016-01-09 VITALS — BP 140/82 | HR 70 | Temp 97.9°F | Resp 18 | Ht 73.5 in | Wt 226.0 lb

## 2016-01-09 DIAGNOSIS — M79644 Pain in right finger(s): Secondary | ICD-10-CM | POA: Diagnosis not present

## 2016-01-09 MED ORDER — NAPROXEN 500 MG PO TABS: 500.0000 mg | ORAL_TABLET | Freq: Two times a day (BID) | ORAL | 0 refills | Status: DC

## 2016-01-09 NOTE — Progress Notes (Signed)
Patient ID: Cory Mcfarland, male    DOB: [birth year], 40 y.o.   MRN: [id number]  PCP: No primary care provider on file.  Chief Complaint  Patient presents with  . Hand Injury    thumb injury playing basketball    Subjective:   HPI 40 year old male presents for evaluation of right thumb pain x 1.5 months. [facility] is [facility] to [facility]. Reports that he was playing basketball and the ball bounced back and hit his thumb and suffered immediate pain and swelling.  Following the swelling, his left thumb along with the inside portion of his hand distal to his left thumb became very ecchymotic. He iced the thumb and hand. Took ibuprofen, pain subsided although never completely resolved. He has been busy with work and travel, therefore he is just following up today to have thumb evaluated. Pt reports gripping motions hurt the most.  Social History   Social History  . Marital status: Married    Spouse name: N/A  . Number of children: N/A  . Years of education: N/A   Occupational History  . Not on file.   Social History Main Topics  . Smoking status: Former Games developermoker  . Smokeless tobacco: Never Used     Comment: quit 12 years ago  . Alcohol use 4.8 oz/week    8 Glasses of wine per week  . Drug use: No  . Sexual activity: Yes   Other Topics Concern  . Not on file   Social History Narrative  . No narrative on file    Family History  Problem Relation Age of Onset  . Hyperlipidemia Mother   . Cancer Father   . Cancer Maternal Grandmother   . Cancer Maternal Grandfather   . Diabetes Paternal Grandfather    Review of Systems See HPI  There are no active problems to display for this patient.  Prior to Admission medications   Medication Sig Start Date End Date Taking? Authorizing Provider  atorvastatin (LIPITOR) 20 MG tablet TAKE 1 TABLET DAILY 12/22/15  Yes Shade FloodJeffrey R Greene, MD  indomethacin (INDOCIN) 50 MG capsule Take 1 capsule (50 mg total) by mouth 3 (three) times daily as needed.  With food for gout flare Patient not taking: Reported on 01/09/2016 07/02/15   Shade FloodJeffrey R Greene, MD  No Known Allergies   Objective:  Physical Exam  Constitutional: He is oriented to person, place, and time. He appears well-developed and well-nourished.  HENT:  Head: Normocephalic and atraumatic.  Eyes: Conjunctivae are normal. Pupils are equal, round, and reactive to light.  Neck: Normal range of motion. Neck supple.  Cardiovascular: Normal rate.   Pulmonary/Chest: Effort normal and breath sounds normal.  Musculoskeletal: Normal range of motion. He exhibits tenderness.  Right thumb remains tender to touch distally and pain is aggravated by gripping movements. Mildly warmer compared to other digits. Negative for brusing or visible swelling.  Neurological: He is alert and oriented to person, place, and time.  Skin: Skin is warm and dry.  Psychiatric: He has a normal mood and affect. His behavior is normal. Judgment and thought content normal.   Vitals:   01/09/16 1339  BP: 140/82  Pulse: 70  Resp: 18  Temp: 97.9 F (36.6 C)    Assessment & Plan:  1. Thumb pain, right - DG Finger Thumb Right  Thumb pain is likely related to a sprain injury. Digital image was negative for fracture. Treating inflammation with antiinflammatory medication.If symptoms do not improve, I will consider a  referral to orthopedics hand specialist.  Godfrey PickKimberly S. Tiburcio PeaHarris, MSN, FNP-C [facility]

## 2016-01-09 NOTE — Patient Instructions (Addendum)
Take naprosyn 500 mg once twice daily with food to relieve inflammation.  Please phone me if no improvement and I will refer you to orthopedics for additional follow-up with a hand specialist.   IF you received an x-ray today, you will receive an invoice from [facility]. Please contact [facility] at [phone] with questions or concerns regarding your invoice.   IF you received labwork today, you will receive an invoice from [facility]. Please contact [facility] at [phone] with questions or concerns regarding your invoice.   Our billing staff will not be able to assist you with questions regarding bills from these companies.  You will be contacted with the lab results as soon as they are available. The fastest way to get your results is to activate your My Chart account. Instructions are located on the last page of this paperwork. If you have not heard from us regarding the results in 2 weeks, please contact this office.    Thumb Sprain A thumb sprain is an injury to one of the strong bands of tissue (ligaments) that connect the bones in your thumb. The ligament can be stretched too much or it can tear. A tear can be either partial or complete. The severity of the sprain depends on how much of the ligament was damaged or torn. What are the causes? A thumb sprain is often caused by a fall or an accident. If you extend your hands to catch an object or to protect yourself, the force of the impact can cause your ligament to stretch too much. This excess tension can also cause your ligament to tear. What increases the risk? This injury is more likely to occur in people who play:  Sports that involve a greater risk of falling, such as skiing.  Sports that involve catching an object, such as basketball. What are the signs or symptoms? Symptoms of this condition include:  Loss of motion in your thumb.  Bruising.  Tenderness.  Swelling. How  is this diagnosed? This condition is diagnosed with a medical history and physical exam. You may also have an X-ray of your thumb. How is this treated? Treatment varies depending on the severity of your sprain. If your ligament is overstretched or partially torn, treatment usually involves keeping your thumb in a fixed position (immobilization) for a period of time. To help you do this, your health care provider will apply a bandage, cast, or splint to keep your thumb from moving until it heals. If your ligament is fully torn, you may need surgery to reconnect the ligament to the bone. After surgery, a cast or splint will be applied and will need to stay on your thumb while it heals. Your health care provider may also suggest exercises or physical therapy to strengthen your thumb. Follow these instructions at home: If you have a cast:  Do not stick anything inside the cast to scratch your skin. Doing that increases your risk of infection.  Check the skin around the cast every day. Report any concerns to your health care provider. You may put lotion on dry skin around the edges of the cast. Do not apply lotion to the skin underneath the cast.  Keep the cast clean and dry. If you have a splint:  Wear it as directed by your health care provider. Remove it only as directed by your health care provider.  Loosen the splint if your fingers become numb and tingle, or if they turn cold and blue.  Keep the splint clean and dry. Bathing  Cover the bandage, cast, or splint with a watertight plastic bag to protect it from water while you take a bath or a shower. Do not let the bandage, cast, or splint get wet. Managing pain, stiffness, and swelling  If directed, apply ice to the injured area (unless you have a cast):  Put ice in a plastic bag.  Place a towel between your skin and the bag.  Leave the ice on for 20 minutes, 2-3 times per day.  Move your fingers often to avoid stiffness and to  lessen swelling.  Raise (elevate) the injured area above the level of your heart while you are sitting or lying down. Driving  Do not drive or operate heavy machinery while taking pain medicine.  Do not drive while wearing a cast or splint on a hand that you use for driving. General instructions  Do not put pressure on any part of your cast or splint until it is fully hardened. This may take several hours.  Take medicines only as directed by your health care provider. These include over-the-counter medicines and prescription medicines.  Keep all follow-up visits as directed by your health care provider. This is important.  Do any exercise or physical therapy as directed by your health care provider.  Do not wear rings on your injured thumb. Contact a health care provider if:  Your pain is not controlled with medicine.  Your bruising or swelling gets worse.  Your cast or splint is damaged. Get help right away if:  Your thumb is numb or blue.  Your thumb feels colder than normal. This information is not intended to replace advice given to you by your health care provider. Make sure you discuss any questions you have with your health care provider. Document Released: 02/21/2004 Document Revised: 09/16/2015 Document Reviewed: 10/25/2013 Elsevier Interactive Patient Education  2017 [employer]

## 2016-01-29 ENCOUNTER — Telehealth: Payer: Self-pay

## 2016-01-29 NOTE — Telephone Encounter (Signed)
Pt is still having thumb issues and wants a referral to an orthopedic office   Best number [phone]

## 2016-02-04 NOTE — Telephone Encounter (Signed)
Pt calling again about referral been calling since 1/2   Please advise

## 2016-02-04 NOTE — Telephone Encounter (Signed)
Can you write a referral for him? Seen 12/17 for thumb pain

## 2016-02-08 ENCOUNTER — Other Ambulatory Visit: Payer: Self-pay | Admitting: Physician Assistant

## 2016-02-08 DIAGNOSIS — M79646 Pain in unspecified finger(s): Secondary | ICD-10-CM

## 2016-02-08 NOTE — Telephone Encounter (Signed)
Pt left a VM on referrals line. Noted in OV notes that if symptoms do not improve, a referral will be considered. Looked over with [place]any Wiseman who placed the referral as the patient is still having issues with this same injury.

## 2016-02-18 ENCOUNTER — Ambulatory Visit (INDEPENDENT_AMBULATORY_CARE_PROVIDER_SITE_OTHER): Payer: Managed Care, Other (non HMO) | Admitting: Orthopaedic Surgery

## 2016-02-18 ENCOUNTER — Encounter (INDEPENDENT_AMBULATORY_CARE_PROVIDER_SITE_OTHER): Payer: Self-pay | Admitting: Orthopaedic Surgery

## 2016-02-18 VITALS — Ht 74.0 in | Wt 220.0 lb

## 2016-02-18 DIAGNOSIS — S63641A Sprain of metacarpophalangeal joint of right thumb, initial encounter: Secondary | ICD-10-CM | POA: Diagnosis not present

## 2016-02-18 NOTE — Progress Notes (Signed)
   Office Visit Note   Patient: Cory Mcfarland           Date of Birth: 1975/04/21           MRN: [id number] Visit Date: 02/18/2016              Requested by: Magdalene RiverBrittany D Wiseman, PA-C [address] [place], [place] [address] PCP: No primary care provider on file.   Assessment & Plan: Visit Diagnoses:  1. Sprain of metacarpophalangeal joint of right thumb, initial encounter     Plan: Impression is right thumb collateral ligament sprain. Discussed with the patient that this can take several months to get better. It's normal to have achiness and throbbing with the injury. I will see him back as needed. Continue over-the-counter NSAIDs.  Follow-Up Instructions: No Follow-up on file.   Orders:  No orders of the defined types were placed in this encounter.  No orders of the defined types were placed in this encounter.     Procedures: No procedures performed   Clinical Data: No additional findings.   Subjective: Chief Complaint  Patient presents with  . Right Thumb - Pain    Patient is a 41 year old right-hand dominant gentleman who injured his thumb a few months ago while playing basketball he continues to ache over the MCP joint of the right thumb. He denies any instability. He has been to the urgent care and x-rays were negative. He's been taking anti-inflammatories. The pain is more sensitive while at cone. The pain does not radiate.    Review of Systems Complete review of systems negative except for history of present illness  Objective: Vital Signs: Ht 6\' 2"  (1.88 m)   Wt 220 lb (99.8 kg)   BMI 28.25 kg/m   Physical Exam Well-developed well-nourished no acute distress alert 3 nonlabored breathing normal judgments affect abdomen soft no lymphadenopathy Ortho Exam Exam of the right thumb shows stable collateral ligaments. He has slight tenderness over the radial and ulnar collateral ligaments. Thumb flexion is normal. Specialty Comments:  No specialty comments  available.  Imaging: No results found.   PMFS History: Patient Active Problem List   Diagnosis Date Noted  . Sprain of metacarpophalangeal joint of right thumb, initial encounter 02/18/2016   Past Medical History:  Diagnosis Date  . Anemia   . Hyperlipidemia     Family History  Problem Relation Age of Onset  . Hyperlipidemia Mother   . Cancer Father   . Cancer Maternal Grandmother   . Cancer Maternal Grandfather   . Diabetes Paternal Grandfather     Past Surgical History:  Procedure Laterality Date  . HERNIA REPAIR     Social History   Occupational History  . Not on file.   Social History Main Topics  . Smoking status: Former Games developermoker  . Smokeless tobacco: Never Used     Comment: quit 12 years ago  . Alcohol use 4.8 oz/week    8 Glasses of wine per week  . Drug use: No  . Sexual activity: Yes

## 2016-03-21 ENCOUNTER — Other Ambulatory Visit: Payer: Self-pay | Admitting: Family Medicine

## 2016-03-21 DIAGNOSIS — E785 Hyperlipidemia, unspecified: Secondary | ICD-10-CM

## 2016-07-24 ENCOUNTER — Ambulatory Visit (INDEPENDENT_AMBULATORY_CARE_PROVIDER_SITE_OTHER): Payer: 59 | Admitting: Family Medicine

## 2016-07-24 ENCOUNTER — Encounter: Payer: Self-pay | Admitting: Family Medicine

## 2016-07-24 VITALS — BP 147/87 | HR 65 | Temp 98.5°F | Resp 18 | Ht 73.74 in | Wt 228.8 lb

## 2016-07-24 DIAGNOSIS — R062 Wheezing: Secondary | ICD-10-CM

## 2016-07-24 DIAGNOSIS — J069 Acute upper respiratory infection, unspecified: Secondary | ICD-10-CM

## 2016-07-24 DIAGNOSIS — R059 Cough, unspecified: Secondary | ICD-10-CM

## 2016-07-24 DIAGNOSIS — R05 Cough: Secondary | ICD-10-CM

## 2016-07-24 MED ORDER — AMOXICILLIN-POT CLAVULANATE 875-125 MG PO TABS: 1.0000 | ORAL_TABLET | Freq: Two times a day (BID) | ORAL | 0 refills | Status: DC

## 2016-07-24 MED ORDER — BENZONATATE 100 MG PO CAPS: 100.0000 mg | ORAL_CAPSULE | Freq: Three times a day (TID) | ORAL | 0 refills | Status: DC | PRN

## 2016-07-24 MED ORDER — ALBUTEROL SULFATE HFA 108 (90 BASE) MCG/ACT IN AERS: 2.0000 | INHALATION_SPRAY | RESPIRATORY_TRACT | 0 refills | Status: DC | PRN

## 2016-07-24 MED ORDER — PREDNISONE 20 MG PO TABS: ORAL_TABLET | ORAL | 0 refills | Status: DC

## 2016-07-24 NOTE — Patient Instructions (Addendum)
Drink plenty of fluids to stay well hydrated  Use the albuterol inhaler 2 inhalations every 4-6 hours as needed for coughing and wheezing  In addition you can take benzonatate cough pills one or 2 pills 3 times daily as needed for cough  Take the prednisone 3 pills daily for 3 days. Take all through the same time, best taken in the morning.  Continue using her Flonase  If you're not improving dramatically over the next 3 days, go ahead and get the antibiotic prescription filled. Otherwise just tear it up.  Return as needed    IF you received an x-ray today, you will receive an invoice from [facility]. Please contact [facility] at [phone] with questions or concerns regarding your invoice.   IF you received labwork today, you will receive an invoice from [facility]. Please contact [facility] at [phone] with questions or concerns regarding your invoice.   Our billing staff will not be able to assist you with questions regarding bills from these companies.  You will be contacted with the lab results as soon as they are available. The fastest way to get your results is to activate your My Chart account. Instructions are located on the last page of this paperwork. If you have not heard from us regarding the results in 2 weeks, please contact this office.

## 2016-07-24 NOTE — Progress Notes (Signed)
Patient ID: Cory Mcfarland, male    DOB: [birth year]  Age: 41 y.o. MRN: [id number]  Chief Complaint  Patient presents with  . Cough    X 1 week    Subjective:   [age] year old man here with a one-week history of a respiratory tract infection. He is blowing some purulent stuff out of his nose but not coughing up purulent material. However the cough is gotten worse. He been wheezing. He goes into fits of coughing times. He does not smoke. He does not have any major history of allergies. He has not had wheezing problems in the past. He has been using some fluticasone nose spray. His family has not been ill.  He had a right otitis media 3 weeks ago treated with amoxicillin.  Current allergies, medications, problem list, past/family and social histories reviewed.  Objective:  BP (!) 147/87 (BP Location: Right Arm, Patient Position: Sitting, Cuff Size: Large)   Pulse 65   Temp 98.5 F (36.9 C) (Oral)   Resp 18   Ht 6' 1.74" (1.873 m)   Wt 228 lb 12.8 oz (103.8 kg)   SpO2 96%   BMI 29.58 kg/m   No major acute distress. TMs normal. Throat clear except the uvula is a little edematous. Neck supple without nodes. Chest has some scattered wheezing, more on the left lower posterior region. No rales or rhonchi.  Assessment & Plan:   Assessment: 1. Wheezing   2. Cough   3. Acute upper respiratory infection       Plan: This is either viral or allergic I think. However did give him some antibiotics in case, but he will not get them filled if he is not doing better.  No orders of the defined types were placed in this encounter.   Meds ordered this encounter  Medications  . benzonatate (TESSALON) 100 MG capsule    Sig: Take 1-2 capsules (100-200 mg total) by mouth 3 (three) times daily as needed.    Dispense:  30 capsule    Refill:  0  . albuterol (PROVENTIL HFA;VENTOLIN HFA) 108 (90 Base) MCG/ACT inhaler    Sig: Inhale 2 puffs into the lungs every 4 (four) hours as needed for wheezing or  shortness of breath (cough, shortness of breath or wheezing.).    Dispense:  1 Inhaler    Refill:  0  . predniSONE (DELTASONE) 20 MG tablet    Sig: Take 3 pills daily for 3 days. Best taken after breakfast.    Dispense:  9 tablet    Refill:  0  . amoxicillin-clavulanate (AUGMENTIN) 875-125 MG tablet    Sig: Take 1 tablet by mouth 2 (two) times daily.    Dispense:  14 tablet    Refill:  0    Do not fill after 07/30/16         Patient Instructions   Drink plenty of fluids to stay well hydrated  Use the albuterol inhaler 2 inhalations every 4-6 hours as needed for coughing and wheezing  In addition you can take benzonatate cough pills one or 2 pills 3 times daily as needed for cough  Take the prednisone 3 pills daily for 3 days. Take all through the same time, best taken in the morning.  Continue using her Flonase  If you're not improving dramatically over the next 3 days, go ahead and get the antibiotic prescription filled. Otherwise just tear it up.  Return as needed    IF you received an x-ray today, you will  receive an Economist from [facility]. Please contact [facility] at [phone] with questions or concerns regarding your invoice.   IF you received labwork today, you will receive an invoice from [facility]. Please contact [facility] at [phone] with questions or concerns regarding your invoice.   Our billing staff will not be able to assist you with questions regarding bills from these companies.  You will be contacted with the lab results as soon as they are available. The fastest way to get your results is to activate your My Chart account. Instructions are located on the last page of this paperwork. If you have not heard from [place] regarding the results in 2 weeks, please contact this office.         Return if symptoms worsen or fail to improve.   Deyjah Kindel, MD 07/24/2016

## 2016-08-11 ENCOUNTER — Other Ambulatory Visit: Payer: Self-pay | Admitting: Urgent Care

## 2016-08-11 DIAGNOSIS — E785 Hyperlipidemia, unspecified: Secondary | ICD-10-CM

## 2016-08-18 ENCOUNTER — Ambulatory Visit (INDEPENDENT_AMBULATORY_CARE_PROVIDER_SITE_OTHER): Payer: 59 | Admitting: Family

## 2016-08-18 ENCOUNTER — Ambulatory Visit (INDEPENDENT_AMBULATORY_CARE_PROVIDER_SITE_OTHER): Payer: 59

## 2016-08-18 ENCOUNTER — Encounter (INDEPENDENT_AMBULATORY_CARE_PROVIDER_SITE_OTHER): Payer: Self-pay | Admitting: Family

## 2016-08-18 VITALS — Ht 73.0 in | Wt 228.0 lb

## 2016-08-18 DIAGNOSIS — S8261XA Displaced fracture of lateral malleolus of right fibula, initial encounter for closed fracture: Secondary | ICD-10-CM | POA: Insufficient documentation

## 2016-08-18 DIAGNOSIS — M25571 Pain in right ankle and joints of right foot: Secondary | ICD-10-CM | POA: Diagnosis not present

## 2016-08-18 NOTE — Progress Notes (Signed)
Office Visit Note   Patient: Cory Mcfarland           Date of Birth: 06/27/1975           MRN: [id number] Visit Date: 08/18/2016              Requested by: No referring provider defined for this encounter. PCP: Patient, No Pcp Per  Chief Complaint  Patient presents with  . Right Ankle - Pain    S/p twist injury on Friday 08/15/16      HPI: The patient is a 41 year old gentleman who reports he was playing basketball last Friday when he twisted his ankle. He has been having swelling and bruising since. Complaining of global ankle pain. He is states he wants to make sure he didn't break anything make sure that he doesn't have anything more than sprain. He has been using a Velcro ankle brace is able to weight-bear but is limping.   Assessment & Plan: Visit Diagnoses:  1. Avulsion fracture of lateral malleolus of right fibula, closed, initial encounter   2. Pain in right ankle and joints of right foot     Plan: We'll place in a fracture boot on the right. He may weight-bear as tolerated Will see him back in 2 weeks. May use ibuprofen or Aleve for pain and swelling. Use ice.  Follow-Up Instructions: Return in about 2 weeks (around 09/01/2016).   Right Ankle Exam  Swelling: moderate  Tenderness  The patient is experiencing tenderness in the ATF, deltoid, CF and lateral malleolus.    Tests  Anterior drawer: 1+ Other  Pulse: present   Comments:  Ecchymosis      Patient is alert, oriented, no adenopathy, well-dressed, normal affect, normal respiratory effort.   Imaging: Xr Ankle 2 Views Right  Result Date: 08/18/2016 Radiographs of the right ankle show a small avulsion fracture of the fibula. The mortise is congruent.    Labs: No results found for: HGBA1C, ESRSEDRATE, CRP, LABURIC, REPTSTATUS, GRAMSTAIN, CULT, LABORGA  Orders:  Orders Placed This Encounter  Procedures  . XR Ankle 2 Views Right   No orders of the defined types were placed in this  encounter.    Procedures: No procedures performed  Clinical Data: No additional findings.  ROS:  All other systems negative, except as noted in the HPI. Review of Systems  Constitutional: Negative for chills and fever.  Musculoskeletal: Positive for arthralgias and joint swelling.  Skin: Positive for color change.    Objective: Vital Signs: Ht 6\' 1"  (1.854 m)   Wt 228 lb (103.4 kg)   BMI 30.08 kg/m   Specialty Comments:  No specialty comments available.  PMFS History: Patient Active Problem List   Diagnosis Date Noted  . Avulsion fracture of lateral malleolus of right fibula, closed, initial encounter 08/18/2016  . Sprain of metacarpophalangeal joint of right thumb, initial encounter 02/18/2016   Past Medical History:  Diagnosis Date  . Anemia   . Hyperlipidemia     Family History  Problem Relation Age of Onset  . Hyperlipidemia Mother   . Cancer Father   . Cancer Maternal Grandmother   . Cancer Maternal Grandfather   . Diabetes Paternal Grandfather     Past Surgical History:  Procedure Laterality Date  . HERNIA REPAIR     Social History   Occupational History  . Not on file.   Social History Main Topics  . Smoking status: Former Games developer  . Smokeless tobacco: Never Used  Comment: quit 12 years ago  . Alcohol use 4.8 oz/week    8 Glasses of wine per week  . Drug use: No  . Sexual activity: Yes

## 2016-08-19 ENCOUNTER — Other Ambulatory Visit: Payer: Self-pay | Admitting: Emergency Medicine

## 2016-08-19 DIAGNOSIS — E7800 Pure hypercholesterolemia, unspecified: Secondary | ICD-10-CM

## 2016-08-19 MED ORDER — ATORVASTATIN CALCIUM 20 MG PO TABS: 20.0000 mg | ORAL_TABLET | Freq: Every day | ORAL | 0 refills | Status: DC

## 2016-08-20 ENCOUNTER — Telehealth (INDEPENDENT_AMBULATORY_CARE_PROVIDER_SITE_OTHER): Payer: Self-pay | Admitting: Family

## 2016-08-20 NOTE — Telephone Encounter (Signed)
Returned call to patient left message to return call [phone] concerning appointment

## 2016-08-28 ENCOUNTER — Ambulatory Visit (INDEPENDENT_AMBULATORY_CARE_PROVIDER_SITE_OTHER): Payer: 59 | Admitting: Family

## 2016-08-28 ENCOUNTER — Encounter (INDEPENDENT_AMBULATORY_CARE_PROVIDER_SITE_OTHER): Payer: Self-pay | Admitting: Family

## 2016-08-28 VITALS — Ht 73.0 in | Wt 228.0 lb

## 2016-08-28 DIAGNOSIS — S8261XA Displaced fracture of lateral malleolus of right fibula, initial encounter for closed fracture: Secondary | ICD-10-CM

## 2016-08-28 DIAGNOSIS — S93491D Sprain of other ligament of right ankle, subsequent encounter: Secondary | ICD-10-CM | POA: Diagnosis not present

## 2016-08-28 NOTE — Progress Notes (Signed)
   Office Visit Note   Patient: Cory Mcfarland           Date of Birth: 1975/06/25           MRN: [id number] Visit Date: 08/28/2016              Requested by: No referring provider defined for this encounter. PCP: Patient, No Pcp Per  Chief Complaint  Patient presents with  . Right Leg - Follow-up    DOI 08/15/16 avulsion fracture lateral malleolus right fib      HPI: The patient is a [age] year old gentleman who is seen in follow up for right ankle sprain with avulsion fracture of lateral malleolus.   Has been doing much better. In cam walker. Wonders what to expect over coming weeks. Has been able to travel walk around airport without issue.    Assessment & Plan: Visit Diagnoses:  1. Avulsion fracture of lateral malleolus of right fibula, closed, initial encounter   2. Sprain of anterior talofibular ligament of right ankle, subsequent encounter     Plan: continue CAM walker provided ASO to gradually work in to. Discussed plan. Patient in agreement. Follow up as needed.  Follow-Up Instructions: Return in about 4 weeks (around 09/25/2016), or if symptoms worsen or fail to improve.   Right Ankle Exam  Swelling: mild  Tenderness  The patient is experiencing tenderness in the ATF, deltoid and CF.    Tests  Anterior drawer: 1+ Other  Pulse: present   Comments:  Resolving ecchymosis      Patient is alert, oriented, no adenopathy, well-dressed, normal affect, normal respiratory effort.   Imaging: No results found.  Labs: No results found for: HGBA1C, ESRSEDRATE, CRP, LABURIC, REPTSTATUS, GRAMSTAIN, CULT, LABORGA  Orders:  No orders of the defined types were placed in this encounter.  No orders of the defined types were placed in this encounter.    Procedures: No procedures performed  Clinical Data: No additional findings.  ROS:  All other systems negative, except as noted in the HPI. Review of Systems  Constitutional: Negative for chills and fever.    Musculoskeletal: Positive for arthralgias and joint swelling.  Skin: Positive for color change.    Objective: Vital Signs: Ht 6\' 1"  (1.854 m)   Wt 228 lb (103.4 kg)   BMI 30.08 kg/m   Specialty Comments:  No specialty comments available.  PMFS History: Patient Active Problem List   Diagnosis Date Noted  . Avulsion fracture of lateral malleolus of right fibula, closed, initial encounter 08/18/2016  . Sprain of metacarpophalangeal joint of right thumb, initial encounter 02/18/2016   Past Medical History:  Diagnosis Date  . Anemia   . Hyperlipidemia     Family History  Problem Relation Age of Onset  . Hyperlipidemia Mother   . Cancer Father   . Cancer Maternal Grandmother   . Cancer Maternal Grandfather   . Diabetes Paternal Grandfather     Past Surgical History:  Procedure Laterality Date  . HERNIA REPAIR     Social History   Occupational History  . Not on file.   Social History Main Topics  . Smoking status: Former Games developermoker  . Smokeless tobacco: Never Used     Comment: quit 12 years ago  . Alcohol use 4.8 oz/week    8 Glasses of wine per week  . Drug use: No  . Sexual activity: Yes

## 2016-09-25 ENCOUNTER — Ambulatory Visit (INDEPENDENT_AMBULATORY_CARE_PROVIDER_SITE_OTHER): Payer: 59 | Admitting: Family Medicine

## 2016-09-25 ENCOUNTER — Encounter: Payer: Self-pay | Admitting: Family Medicine

## 2016-09-25 VITALS — BP 133/87 | HR 71 | Temp 98.1°F | Resp 16 | Ht 75.0 in | Wt 226.0 lb

## 2016-09-25 DIAGNOSIS — Z0000 Encounter for general adult medical examination without abnormal findings: Secondary | ICD-10-CM | POA: Diagnosis not present

## 2016-09-25 DIAGNOSIS — Z131 Encounter for screening for diabetes mellitus: Secondary | ICD-10-CM

## 2016-09-25 DIAGNOSIS — Z23 Encounter for immunization: Secondary | ICD-10-CM | POA: Diagnosis not present

## 2016-09-25 DIAGNOSIS — E785 Hyperlipidemia, unspecified: Secondary | ICD-10-CM

## 2016-09-25 DIAGNOSIS — Z7189 Other specified counseling: Secondary | ICD-10-CM | POA: Diagnosis not present

## 2016-09-25 DIAGNOSIS — E7800 Pure hypercholesterolemia, unspecified: Secondary | ICD-10-CM | POA: Diagnosis not present

## 2016-09-25 DIAGNOSIS — Z7184 Encounter for health counseling related to travel: Secondary | ICD-10-CM

## 2016-09-25 DIAGNOSIS — Z862 Personal history of diseases of the blood and blood-forming organs and certain disorders involving the immune mechanism: Secondary | ICD-10-CM

## 2016-09-25 MED ORDER — ATORVASTATIN CALCIUM 20 MG PO TABS: 20.0000 mg | ORAL_TABLET | Freq: Every day | ORAL | 3 refills | Status: DC

## 2016-09-25 MED ORDER — ZOLPIDEM TARTRATE 5 MG PO TABS: 5.0000 mg | ORAL_TABLET | Freq: Every evening | ORAL | 0 refills | Status: DC | PRN

## 2016-09-25 NOTE — Progress Notes (Signed)
Subjective:  This chart was scribed for Shade Flood, MD by Veverly Fells, at Primary Care at [facility].  This patient was seen in room 26 and the patient's care was started at 10:03 AM.   Chief Complaint  Patient presents with  . Annual Exam  . Medication Refill    lipitor     Patient ID: Cory Mcfarland, male    DOB: Jun 03, 1975, 41 y.o.   MRN: [id number]  HPI HPI Comments: Cory Mcfarland is a 41 y.o. male who presents to Primary Care at [facility] for an annual physical exam. He has a history of hyperlipidemia and allergic rhinitis.  I last saw him for a physical in 2016, then other concerns in March 2017.    Patient may potentially go to [facility] for business in the near future and would like to try Ambien for the flight.  He has never used it in the past.    Hyperlipidemia: Patient is on Lipitor 20 mg QD.-- Patient is compliant with this medication and denies any side effects.   Lab Results  Component Value Date   CHOL 173 04/05/2015   HDL 38 (L) 04/05/2015   LDLCALC 92 04/05/2015   TRIG 215 (H) 04/05/2015   CHOLHDL 4.6 04/05/2015   Lab Results  Component Value Date   ALT 37 04/05/2015   AST 24 04/05/2015   ALKPHOS 41 04/05/2015   BILITOT 0.5 04/05/2015    Cancer screening: History of anemia, he had a colonoscopy(three years ago) , endoscopy and capsule endoscopy ,history of hemorrhoids, plan for recheck colonoscopy in 10 years. CBC was normal in March 2017.  Patients father had brain, lung, kidney (was first diagnosed with this), and lymph cancer, he was a smoker,died at 48.  Patient smoked in 2003 for 5 years.  Prostate:  He has no family history of prostate cancer. He denies any urinary symptoms.    Immunizations: Immunization History  Administered Date(s) Administered  . Influenza-Unspecified 11/28/2015  Tetanus: He would like a t-dap today.  Flu: Patient will be getting his flu shot at work.    Depression: Depression screen [facility] 2/9 09/25/2016 07/24/2016  01/09/2016 07/02/2015 04/11/2015  Decreased Interest 0 0 0 0 0  Down, Depressed, Hopeless 0 0 0 0 0  PHQ - 2 Score 0 0 0 0 0    Vision: Patient has not been to the eye doctor recently and will schedule this.   Visual Acuity Screening   Right eye Left eye Both eyes  Without correction:     With correction: 20/15 20/15 20/15     Exercise:Patient exercises 4-5 days per week.    Dentist: He goes to the dentist every 6 months.    STI screening: Patient would not like STI testing completed today. Last had HIV test in college.    Patient Active Problem List   Diagnosis Date Noted  . Avulsion fracture of lateral malleolus of right fibula, closed, initial encounter 08/18/2016  . Sprain of metacarpophalangeal joint of right thumb, initial encounter 02/18/2016   Past Medical History:  Diagnosis Date  . Anemia   . Ankle sprain   . Hyperlipidemia    Past Surgical History:  Procedure Laterality Date  . HERNIA REPAIR     No Known Allergies Prior to Admission medications   Medication Sig Start Date End Date Taking? Authorizing Provider  albuterol (PROVENTIL HFA;VENTOLIN HFA) 108 (90 Base) MCG/ACT inhaler Inhale 2 puffs into the lungs every 4 (four) hours as needed for wheezing or shortness of  breath (cough, shortness of breath or wheezing.). 07/24/16   Peyton NajjarHopper, David H, MD  amoxicillin-clavulanate (AUGMENTIN) 875-125 MG tablet Take 1 tablet by mouth 2 (two) times daily. 07/24/16   Peyton NajjarHopper, David H, MD  atorvastatin (LIPITOR) 20 MG tablet Take 1 tablet (20 mg total) by mouth daily at 6 PM. 30 day supply given until seen by provider 08/19/16   Shade FloodGreene, Velmer Broadfoot R, MD  benzonatate (TESSALON) 100 MG capsule Take 1-2 capsules (100-200 mg total) by mouth 3 (three) times daily as needed. 07/24/16   Peyton NajjarHopper, David H, MD  indomethacin (INDOCIN) 50 MG capsule Take 1 capsule (50 mg total) by mouth 3 (three) times daily as needed. With food for gout flare 07/02/15   Shade FloodGreene, Keevin Panebianco R, MD  naproxen (NAPROSYN) 500  MG tablet Take 1 tablet (500 mg total) by mouth 2 (two) times daily with a meal. 01/09/16   Bing NeighborsHarris, Kimberly S, FNP  predniSONE (DELTASONE) 20 MG tablet Take 3 pills daily for 3 days. Best taken after breakfast. 07/24/16   Peyton NajjarHopper, David H, MD   Social History   Social History  . Marital status: Married    Spouse name: N/A  . Number of children: N/A  . Years of education: N/A   Occupational History  . Not on file.   Social History Main Topics  . Smoking status: Former Games developermoker  . Smokeless tobacco: Never Used     Comment: quit 12 years ago  . Alcohol use 4.8 oz/week    8 Glasses of wine per week  . Drug use: No  . Sexual activity: Yes   Other Topics Concern  . Not on file   Social History Narrative  . No narrative on file      Review of Systems  Constitutional: Negative for chills and fever.  Eyes: Negative for pain and redness.  Respiratory: Negative for cough and shortness of breath.   Gastrointestinal: Negative for abdominal pain, nausea and vomiting.  Genitourinary: Negative for difficulty urinating.  Musculoskeletal: Negative for neck pain and neck stiffness.  Neurological: Negative for syncope and speech difficulty.       Objective:   Physical Exam  Constitutional: He is oriented to person, place, and time. He appears well-developed and well-nourished.  HENT:  Head: Normocephalic and atraumatic.  Right Ear: External ear normal.  Left Ear: External ear normal.  Mouth/Throat: Oropharynx is clear and moist.  Eyes: Pupils are equal, round, and reactive to light. Conjunctivae and EOM are normal.  Neck: Normal range of motion. Neck supple. No thyromegaly present.  Cardiovascular: Normal rate, regular rhythm, normal heart sounds and intact distal pulses.   Pulmonary/Chest: Effort normal and breath sounds normal. No respiratory distress. He has no wheezes.  Abdominal: Soft. He exhibits no distension. There is no tenderness.  Musculoskeletal: Normal range of motion.  He exhibits no edema or tenderness.  Lymphadenopathy:    He has no cervical adenopathy.  Neurological: He is alert and oriented to person, place, and time. He has normal reflexes.  Skin: Skin is warm and dry.  Psychiatric: He has a normal mood and affect. His behavior is normal.  Vitals reviewed.    Vitals:   09/25/16 0943  BP: 133/87  Pulse: 71  Resp: 16  Temp: 98.1 F (36.7 C)  TempSrc: Oral  SpO2: 98%  Weight: 226 lb (102.5 kg)  Height: 6\' 3"  (1.905 m)      Assessment & Plan:   Cory Mcfarland is a 41 y.o. male Annual physical exam  - -  anticipatory guidance as below in AVS, screening labs above. Health maintenance items as above in HPI discussed/recommended as applicable.   Need for diphtheria-tetanus-pertussis (Tdap) vaccine- given  Hyperlipidemia, unspecified hyperlipidemia type - Plan: Lipid panel Pure hypercholesterolemia - Plan: atorvastatin (LIPITOR) 20 MG tablet  -Tolerating Lipitor, continue same dose. Labs pending  Counseling about travel - Plan: zolpidem (AMBIEN) 5 MG tablet  -Situational insomnia with flying. Can try low-dose Ambien, recommended initial trial before travel.  - Recommended CDC website to look into other immunizations for travel based on his location.  History of anemia - Plan: CBC  Screening for diabetes mellitus - Plan: Comprehensive metabolic panel   Meds ordered this encounter  Medications  . zolpidem (AMBIEN) 5 MG tablet    Sig: Take 1-2 tablets (5-10 mg total) by mouth at bedtime as needed for sleep.    Dispense:  10 tablet    Refill:  0  . atorvastatin (LIPITOR) 20 MG tablet    Sig: Take 1 tablet (20 mg total) by mouth daily at 6 PM.    Dispense:  90 tablet    Refill:  3   Patient Instructions    Try the Ambien 1-2 pills at night prior to traveling to make sure you tolerate that medication okay. See information on CDC website below regarding travel and if you require starting immunizations based on your location. Please  follow-up if other immunizations or medication is needed.  Continue Lipitor same dose.  Tdap given today.   DialForum.co.uk  Keeping you healthy  Get these tests  Blood pressure- Have your blood pressure checked once a year by your healthcare provider.  Normal blood pressure is 120/80.  Weight- Have your body mass index (BMI) calculated to screen for obesity.  BMI is a measure of body fat based on height and weight. You can also calculate your own BMI at https://www.west-esparza.com/.  Cholesterol- Have your cholesterol checked regularly starting at age 89, sooner may be necessary if you have diabetes, high blood pressure, if a family member developed heart diseases at an early age or if you smoke.   Chlamydia, HIV, and other sexual transmitted disease- Get screened each year until the age of 73 then within three months of each new sexual partner.  Diabetes- Have your blood sugar checked regularly if you have high blood pressure, high cholesterol, a family history of diabetes or if you are overweight.  Get these vaccines  Flu shot- Every fall.  Tetanus shot- Every 10 years.  Menactra- Single dose; prevents meningitis.  Take these steps  Don't smoke- If you do smoke, ask your healthcare provider about quitting. For tips on how to quit, go to www.smokefree.gov or call 1-800-QUIT-NOW.  Be physically active- Exercise 5 days a week for at least 30 minutes.  If you are not already physically active start slow and gradually work up to 30 minutes of moderate physical activity.  Examples of moderate activity include walking briskly, mowing the yard, dancing, swimming bicycling, etc.  Eat a healthy diet- Eat a variety of healthy foods such as fruits, vegetables, low fat milk, low fat cheese, yogurt, lean meats, poultry, fish, beans, tofu, etc.  For more information on healthy eating, go to www.thenutritionsource.org  Drink alcohol in moderation- Limit alcohol  intake two drinks or less a day.  Never drink and drive.  Dentist- Brush and floss teeth twice daily; visit your dentis twice a year.  Depression-Your emotional health is as important as your physical health.  If you're feeling  down, losing interest in things you normally enjoy please talk with your healthcare provider.  Gun Safety- If you keep a gun in your home, keep it unloaded and with the safety lock on.  Bullets should be stored separately.  Helmet use- Always wear a helmet when riding a motorcycle, bicycle, rollerblading or skateboarding.  Safe sex- If you may be exposed to a sexually transmitted infection, use a condom  Seat belts- Seat bels can save your life; always wear one.  Smoke/Carbon Monoxide detectors- These detectors need to be installed on the appropriate level of your home.  Replace batteries at least once a year.  Skin Cancer- When out in the sun, cover up and use sunscreen SPF 15 or higher.  Violence- If anyone is threatening or hurting you, please tell your healthcare provider.   IF you received an x-ray today, you will receive an invoice from [facility]. Please contact [facility] at [phone] with questions or concerns regarding your invoice.   IF you received labwork today, you will receive an invoice from [facility]. Please contact [facility] at [phone] with questions or concerns regarding your invoice.   Our billing staff will not be able to assist you with questions regarding bills from these companies.  You will be contacted with the lab results as soon as they are available. The fastest way to get your results is to activate your My Chart account. Instructions are located on the last page of this paperwork. If you have not heard from [place] regarding the results in 2 weeks, please contact this office.       I personally performed the services described in this documentation, which was scribed in my presence. The recorded information  has been reviewed and considered for accuracy and completeness, addended by me as needed, and agree with information above.  Signed,   Meredith Staggers, MD Primary Care at [facility].  09/27/16 2:29 PM

## 2016-09-25 NOTE — Patient Instructions (Addendum)
Try the Ambien 1-2 pills at night prior to traveling to make sure you tolerate that medication okay. See information on CDC website below regarding travel and if you require starting immunizations based on your location. Please follow-up if other immunizations or medication is needed.  Continue Lipitor same dose.  Tdap given today.   DialForum.co.ukhttps://wwwnc.cdc.gov/travel/destinations/list/  Keeping you healthy  Get these tests  Blood pressure- Have your blood pressure checked once a year by your healthcare provider.  Normal blood pressure is 120/80.  Weight- Have your body mass index (BMI) calculated to screen for obesity.  BMI is a measure of body fat based on height and weight. You can also calculate your own BMI at https://www.west-esparza.com/www.nhlbisupport.com/bmi/.  Cholesterol- Have your cholesterol checked regularly starting at age 41, sooner may be necessary if you have diabetes, high blood pressure, if a family member developed heart diseases at an early age or if you smoke.   Chlamydia, HIV, and other sexual transmitted disease- Get screened each year until the age of 41 then within three months of each new sexual partner.  Diabetes- Have your blood sugar checked regularly if you have high blood pressure, high cholesterol, a family history of diabetes or if you are overweight.  Get these vaccines  Flu shot- Every fall.  Tetanus shot- Every 10 years.  Menactra- Single dose; prevents meningitis.  Take these steps  Don't smoke- If you do smoke, ask your healthcare provider about quitting. For tips on how to quit, go to www.smokefree.gov or call 1-800-QUIT-NOW.  Be physically active- Exercise 5 days a week for at least 30 minutes.  If you are not already physically active start slow and gradually work up to 30 minutes of moderate physical activity.  Examples of moderate activity include walking briskly, mowing the yard, dancing, swimming bicycling, etc.  Eat a healthy diet- Eat a variety of healthy foods  such as fruits, vegetables, low fat milk, low fat cheese, yogurt, lean meats, poultry, fish, beans, tofu, etc.  For more information on healthy eating, go to www.thenutritionsource.org  Drink alcohol in moderation- Limit alcohol intake two drinks or less a day.  Never drink and drive.  Dentist- Brush and floss teeth twice daily; visit your dentis twice a year.  Depression-Your emotional health is as important as your physical health.  If you're feeling down, losing interest in things you normally enjoy please talk with your healthcare provider.  Gun Safety- If you keep a gun in your home, keep it unloaded and with the safety lock on.  Bullets should be stored separately.  Helmet use- Always wear a helmet when riding a motorcycle, bicycle, rollerblading or skateboarding.  Safe sex- If you may be exposed to a sexually transmitted infection, use a condom  Seat belts- Seat bels can save your life; always wear one.  Smoke/Carbon Monoxide detectors- These detectors need to be installed on the appropriate level of your home.  Replace batteries at least once a year.  Skin Cancer- When out in the sun, cover up and use sunscreen SPF 15 or higher.  Violence- If anyone is threatening or hurting you, please tell your healthcare provider.   IF you received an x-ray today, you will receive an invoice from [facility]. Please contact [facility] at [phone] with questions or concerns regarding your invoice.   IF you received labwork today, you will receive an invoice from [facility]. Please contact [facility] at [phone] with questions or concerns regarding your invoice.   Our billing staff will not  be able to assist you with questions regarding bills from these companies.  You will be contacted with the lab results as soon as they are available. The fastest way to get your results is to activate your My Chart account. Instructions are located on the last page of this paperwork. If  you have not heard from us regarding the results in 2 weeks, please contact this office.

## 2016-09-26 LAB — COMPREHENSIVE METABOLIC PANEL
ALBUMIN: 4.8 g/dL (ref 3.5–5.5)
ALK PHOS: 49 IU/L (ref 39–117)
ALT: 40 IU/L (ref 0–44)
AST: 29 IU/L (ref 0–40)
Albumin/Globulin Ratio: 1.8 (ref 1.2–2.2)
BILIRUBIN TOTAL: 0.6 mg/dL (ref 0.0–1.2)
BUN / CREAT RATIO: 15 (ref 9–20)
BUN: 16 mg/dL (ref 6–24)
CHLORIDE: 104 mmol/L (ref 96–106)
CO2: 22 mmol/L (ref 20–29)
Calcium: 10.1 mg/dL (ref 8.7–10.2)
Creatinine, Ser: 1.05 mg/dL (ref 0.76–1.27)
GFR calc Af Amer: 102 mL/min/{1.73_m2} (ref >59)
GFR calc non Af Amer: 88 mL/min/{1.73_m2} (ref >59)
GLOBULIN, TOTAL: 2.7 g/dL (ref 1.5–4.5)
Glucose: 93 mg/dL (ref 65–99)
POTASSIUM: 4.8 mmol/L (ref 3.5–5.2)
SODIUM: 142 mmol/L (ref 134–144)
Total Protein: 7.5 g/dL (ref 6.0–8.5)

## 2016-09-26 LAB — CBC
Hematocrit: 43.8 % (ref 37.5–51.0)
Hemoglobin: 14.2 g/dL (ref 13.0–17.7)
MCH: 26.3 pg — AB (ref 26.6–33.0)
MCHC: 32.4 g/dL (ref 31.5–35.7)
MCV: 81 fL (ref 79–97)
Platelets: 213 10*3/uL (ref 150–379)
RBC: 5.4 x10E6/uL (ref 4.14–5.80)
RDW: 14.5 % (ref 12.3–15.4)
WBC: 3.4 10*3/uL (ref 3.4–10.8)

## 2016-09-26 LAB — LIPID PANEL
CHOLESTEROL TOTAL: 164 mg/dL (ref 100–199)
Chol/HDL Ratio: 3.9 ratio (ref 0.0–5.0)
HDL: 42 mg/dL (ref >39)
LDL CALC: 69 mg/dL (ref 0–99)
Triglycerides: 267 mg/dL — ABNORMAL HIGH (ref 0–149)
VLDL Cholesterol Cal: 53 mg/dL — ABNORMAL HIGH (ref 5–40)

## 2016-11-21 ENCOUNTER — Encounter: Payer: Self-pay | Admitting: Family Medicine

## 2016-11-21 ENCOUNTER — Ambulatory Visit (INDEPENDENT_AMBULATORY_CARE_PROVIDER_SITE_OTHER): Payer: 59 | Admitting: Family Medicine

## 2016-11-21 VITALS — BP 138/82 | HR 66 | Temp 98.8°F | Resp 16 | Ht 74.41 in | Wt 238.0 lb

## 2016-11-21 DIAGNOSIS — J019 Acute sinusitis, unspecified: Secondary | ICD-10-CM

## 2016-11-21 DIAGNOSIS — R05 Cough: Secondary | ICD-10-CM

## 2016-11-21 DIAGNOSIS — R059 Cough, unspecified: Secondary | ICD-10-CM

## 2016-11-21 MED ORDER — AMOXICILLIN-POT CLAVULANATE 875-125 MG PO TABS: 1.0000 | ORAL_TABLET | Freq: Two times a day (BID) | ORAL | 0 refills | Status: DC

## 2016-11-21 NOTE — Progress Notes (Signed)
Subjective:  By signing my name below, I, Cory Mcfarland, attest that this documentation has been prepared under the direction and in the presence of Cory FloodJeffrey R Gwenlyn Hottinger, MD Electronically Signed: Charline BillsEssence Mcfarland, ED Scribe 11/21/2016 at 4:12 PM.   Patient ID: Cory Mcfarland, male    DOB: February 28, 1975, 41 y.o.   MRN: [id number]  Chief Complaint  Patient presents with  . Nasal Congestion    x 3 weeks   . Cough   HPI Cory Mcfarland is a 41 y.o. male who [facility] complaining of waxing and waning productive cough with yellow sputum, nasal congestion with yellow colored mucus, rhinorrhea and sinus pressure x 3 weeks. Pt states that he noticed sore throat which has resolved and other URI symptoms the day after receiving his flu vaccine. Denies fever, chills, diaphoresis. Pt has tried Nasonex and Mucinex D for 2-3 days with some temporary relief. No sick contacts.  Patient Active Problem List   Diagnosis Date Noted  . Avulsion fracture of lateral malleolus of right fibula, closed, initial encounter 08/18/2016  . Sprain of metacarpophalangeal joint of right thumb, initial encounter 02/18/2016   Past Medical History:  Diagnosis Date  . Anemia   . Ankle sprain   . Hyperlipidemia    Past Surgical History:  Procedure Laterality Date  . HERNIA REPAIR     No Known Allergies Prior to Admission medications   Medication Sig Start Date End Date Taking? Authorizing Provider  atorvastatin (LIPITOR) 20 MG tablet Take 1 tablet (20 mg total) by mouth daily at 6 PM. 09/25/16   Cory FloodGreene, Ahmet Schank R, MD  zolpidem (AMBIEN) 5 MG tablet Take 1-2 tablets (5-10 mg total) by mouth at bedtime as needed for sleep. 09/25/16   Cory FloodGreene, Ilyaas Musto R, MD   Social History   Social History  . Marital status: Married    Spouse name: N/A  . Number of children: N/A  . Years of education: N/A   Occupational History  . Not on file.   Social History Main Topics  . Smoking status: Former Games developermoker  .  Smokeless tobacco: Never Used     Comment: quit 12 years ago  . Alcohol use 4.8 oz/week    8 Glasses of wine per week  . Drug use: No  . Sexual activity: Yes   Other Topics Concern  . Not on file   Social History Narrative  . No narrative on file   Review of Systems  Constitutional: Negative for chills, diaphoresis and fever.  HENT: Positive for congestion, rhinorrhea and sinus pressure.   Respiratory: Positive for cough.        Objective:   Physical Exam  Constitutional: He is oriented to person, place, and time. He appears well-developed and well-nourished.  HENT:  Head: Normocephalic and atraumatic.  Right Ear: Tympanic membrane, external ear and ear canal normal.  Left Ear: Tympanic membrane, external ear and ear canal normal.  Nose: No rhinorrhea. Right sinus exhibits no maxillary sinus tenderness and no frontal sinus tenderness. Left sinus exhibits no maxillary sinus tenderness and no frontal sinus tenderness.  Mouth/Throat: Oropharynx is clear and moist and mucous membranes are normal. No oropharyngeal exudate or posterior oropharyngeal erythema.  TMs are pealry grey.  Eyes: Pupils are equal, round, and reactive to light. Conjunctivae are normal.  Neck: Neck supple.  Cardiovascular: Normal rate, regular rhythm, normal heart sounds and intact distal pulses.   No murmur heard. Pulmonary/Chest: Effort normal and breath sounds normal. He has no  wheezes. He has no rhonchi. He has no rales.  Lungs are clear to auscultation.  Abdominal: Soft. There is no tenderness.  Lymphadenopathy:    He has no cervical adenopathy.  Neurological: He is alert and oriented to person, place, and time.  Skin: Skin is warm and dry. No rash noted.  Psychiatric: He has a normal mood and affect. His behavior is normal.  Vitals reviewed.  Vitals:   11/21/16 1600  BP: 138/82  Pulse: 66  Resp: 16  Temp: 98.8 F (37.1 C)  TempSrc: Oral  SpO2: 98%  Weight: 238 lb (108 kg)  Height: 6' 2.41"  (1.89 m)       Assessment & Plan:    Cory Mcfarland is a 41 y.o. male Acute sinusitis, recurrence not specified, unspecified location - Plan: amoxicillin-clavulanate (AUGMENTIN) 875-125 MG tablet  Cough  3 week history of persistent sinus congestion, cough, discolored nasal discharge/phlegm. Suspected sinusitis with postnasal drip causing cough, lungs clear, afebrile.  -Start Augmentin, potential side effects discussed, symptomatic care with saline nasal spray, decongestants short-term, RTC precautions.  Meds ordered this encounter  Medications  . amoxicillin-clavulanate (AUGMENTIN) 875-125 MG tablet    Sig: Take 1 tablet by mouth 2 (two) times daily.    Dispense:  20 tablet    Refill:  0   Patient Instructions      You appear to have some component of sinusitis. Some of the cough may be due to drainage from the sinuses. Augmentin 1 pill twice per day, saline nasal spray 3-4 times per day, decongestants short-term if needed, and Mucinex as needed for cough. If not improving in the next week to 10 days, let me know. Return to the clinic or go to the nearest emergency room if any of your symptoms worsen or new symptoms occur.   Sinusitis, Adult Sinusitis is soreness and inflammation of your sinuses. Sinuses are hollow spaces in the bones around your face. Your sinuses are located:  Around your eyes.  In the middle of your forehead.  Behind your nose.  In your cheekbones.  Your sinuses and nasal passages are lined with a stringy fluid (mucus). Mucus normally drains out of your sinuses. When your nasal tissues become inflamed or swollen, the mucus can become trapped or blocked so air cannot flow through your sinuses. This allows bacteria, viruses, and funguses to grow, which leads to infection. Sinusitis can develop quickly and last for 7?10 days (acute) or for more than 12 weeks (chronic). Sinusitis often develops after a cold. What are the causes? This condition is caused by  anything that creates swelling in the sinuses or stops mucus from draining, including:  Allergies.  Asthma.  Bacterial or viral infection.  Abnormally shaped bones between the nasal passages.  Nasal growths that contain mucus (nasal polyps).  Narrow sinus openings.  Pollutants, such as chemicals or irritants in the air.  A foreign object stuck in the nose.  A fungal infection. This is rare.  What increases the risk? The following factors may make you more likely to develop this condition:  Having allergies or asthma.  Having had a recent cold or respiratory tract infection.  Having structural deformities or blockages in your nose or sinuses.  Having a weak immune system.  Doing a lot of swimming or diving.  Overusing nasal sprays.  Smoking.  What are the signs or symptoms? The main symptoms of this condition are pain and a feeling of pressure around the affected sinuses. Other symptoms include:  Upper toothache.  Earache.  Headache.  Bad breath.  Decreased sense of smell and taste.  A cough that may get worse at night.  Fatigue.  Fever.  Thick drainage from your nose. The drainage is often green and it may contain pus (purulent).  Stuffy nose or congestion.  Postnasal drip. This is when extra mucus collects in the throat or back of the nose.  Swelling and warmth over the affected sinuses.  Sore throat.  Sensitivity to light.  How is this diagnosed? This condition is diagnosed based on symptoms, a medical history, and a physical exam. To find out if your condition is acute or chronic, your health care provider may:  Look in your nose for signs of nasal polyps.  Tap over the affected sinus to check for signs of infection.  View the inside of your sinuses using an imaging device that has a light attached (endoscope).  If your health care provider suspects that you have chronic sinusitis, you may also:  Be tested for allergies.  Have a  sample of mucus taken from your nose (nasal culture) and checked for bacteria.  Have a mucus sample examined to see if your sinusitis is related to an allergy.  If your sinusitis does not respond to treatment and it lasts longer than 8 weeks, you may have an MRI or CT scan to check your sinuses. These scans also help to determine how severe your infection is. In rare cases, a bone biopsy may be done to rule out more serious types of fungal sinus disease. How is this treated? Treatment for sinusitis depends on the cause and whether your condition is chronic or acute. If a virus is causing your sinusitis, your symptoms will go away on their own within 10 days. You may be given medicines to relieve your symptoms, including:  Topical nasal decongestants. They shrink swollen nasal passages and let mucus drain from your sinuses.  Antihistamines. These drugs block inflammation that is triggered by allergies. This can help to ease swelling in your nose and sinuses.  Topical nasal corticosteroids. These are nasal sprays that ease inflammation and swelling in your nose and sinuses.  Nasal saline washes. These rinses can help to get rid of thick mucus in your nose.  If your condition is caused by bacteria, you will be given an antibiotic medicine. If your condition is caused by a fungus, you will be given an antifungal medicine. Surgery may be needed to correct underlying conditions, such as narrow nasal passages. Surgery may also be needed to remove polyps. Follow these instructions at home: Medicines  Take, use, or apply over-the-counter and prescription medicines only as told by your health care provider. These may include nasal sprays.  If you were prescribed an antibiotic medicine, take it as told by your health care provider. Do not stop taking the antibiotic even if you start to feel better. Hydrate and Humidify  Drink enough water to keep your urine clear or pale yellow. Staying hydrated will  help to thin your mucus.  Use a cool mist humidifier to keep the humidity level in your home above 50%.  Inhale steam for 10-15 minutes, 3-4 times a day or as told by your health care provider. You can do this in the bathroom while a hot shower is running.  Limit your exposure to cool or dry air. Rest  Rest as much as possible.  Sleep with your head raised (elevated).  Make sure to get enough sleep each night. General  instructions  Apply a warm, moist washcloth to your face 3-4 times a day or as told by your health care provider. This will help with discomfort.  Wash your hands often with soap and water to reduce your exposure to viruses and other germs. If soap and water are not available, use hand sanitizer.  Do not smoke. Avoid being around people who are smoking (secondhand smoke).  Keep all follow-up visits as told by your health care provider. This is important. Contact a health care provider if:  You have a fever.  Your symptoms get worse.  Your symptoms do not improve within 10 days. Get help right away if:  You have a severe headache.  You have persistent vomiting.  You have pain or swelling around your face or eyes.  You have vision problems.  You develop confusion.  Your neck is stiff.  You have trouble breathing. This information is not intended to replace advice given to you by your health care provider. Make sure you discuss any questions you have with your health care provider. Document Released: 01/13/2005 Document Revised: 09/09/2015 Document Reviewed: 11/08/2014 Elsevier Interactive Patient Education  2017 [employer]  Cough, Adult Coughing is a reflex that clears your throat and your airways. Coughing helps to heal and protect your lungs. It is normal to cough occasionally, but a cough that happens with other symptoms or lasts a long time may be a sign of a condition that needs treatment. A cough may last only 2-3 weeks (acute), or it may last  longer than 8 weeks (chronic). What are the causes? Coughing is commonly caused by:  Breathing in substances that irritate your lungs.  A viral or bacterial respiratory infection.  Allergies.  Asthma.  Postnasal drip.  Smoking.  Acid backing up from the stomach into the esophagus (gastroesophageal reflux).  Certain medicines.  Chronic lung problems, including COPD (or rarely, lung cancer).  Other medical conditions such as heart failure.  Follow these instructions at home: Pay attention to any changes in your symptoms. Take these actions to help with your discomfort:  Take medicines only as told by your health care provider. ? If you were prescribed an antibiotic medicine, take it as told by your health care provider. Do not stop taking the antibiotic even if you start to feel better. ? Talk with your health care provider before you take a cough suppressant medicine.  Drink enough fluid to keep your urine clear or pale yellow.  If the air is dry, use a cold steam vaporizer or humidifier in your bedroom or your home to help loosen secretions.  Avoid anything that causes you to cough at work or at home.  If your cough is worse at night, try sleeping in a semi-upright position.  Avoid cigarette smoke. If you smoke, quit smoking. If you need help quitting, ask your health care provider.  Avoid caffeine.  Avoid alcohol.  Rest as needed.  Contact a health care provider if:  You have new symptoms.  You cough up pus.  Your cough does not get better after 2-3 weeks, or your cough gets worse.  You cannot control your cough with suppressant medicines and you are losing sleep.  You develop pain that is getting worse or pain that is not controlled with pain medicines.  You have a fever.  You have unexplained weight loss.  You have night sweats. Get help right away if:  You cough up blood.  You have difficulty breathing.  Your heartbeat  is very fast. This  information is not intended to replace advice given to you by your health care provider. Make sure you discuss any questions you have with your health care provider. Document Released: 07/12/2010 Document Revised: 06/21/2015 Document Reviewed: 03/22/2014 Elsevier Interactive Patient Education  2017 [employer].      IF you received an x-ray today, you will receive an invoice from [facility]. Please contact [facility] at [phone] with questions or concerns regarding your invoice.   IF you received labwork today, you will receive an invoice from [facility]. Please contact [facility] at [phone] with questions or concerns regarding your invoice.   Our billing staff will not be able to assist you with questions regarding Mcfarland from these companies.  You will be contacted with the lab results as soon as they are available. The fastest way to get your results is to activate your My Chart account. Instructions are located on the last page of this paperwork. If you have not heard from [place] regarding the results in 2 weeks, please contact this office.       I personally performed the services described in this documentation, which was scribed in my presence. The recorded information has been reviewed and considered for accuracy and completeness, addended by me as needed, and agree with information above.  Signed,   Meredith Staggers, MD Primary Care at [facility].  11/21/16 5:13 PM

## 2016-11-21 NOTE — Patient Instructions (Addendum)
You appear to have some component of sinusitis. Some of the cough may be due to drainage from the sinuses. Augmentin 1 pill twice per day, saline nasal spray 3-4 times per day, decongestants short-term if needed, and Mucinex as needed for cough. If not improving in the next week to 10 days, let me know. Return to the clinic or go to the nearest emergency room if any of your symptoms worsen or new symptoms occur.   Sinusitis, Adult Sinusitis is soreness and inflammation of your sinuses. Sinuses are hollow spaces in the bones around your face. Your sinuses are located:  Around your eyes.  In the middle of your forehead.  Behind your nose.  In your cheekbones.  Your sinuses and nasal passages are lined with a stringy fluid (mucus). Mucus normally drains out of your sinuses. When your nasal tissues become inflamed or swollen, the mucus can become trapped or blocked so air cannot flow through your sinuses. This allows bacteria, viruses, and funguses to grow, which leads to infection. Sinusitis can develop quickly and last for 7?10 days (acute) or for more than 12 weeks (chronic). Sinusitis often develops after a cold. What are the causes? This condition is caused by anything that creates swelling in the sinuses or stops mucus from draining, including:  Allergies.  Asthma.  Bacterial or viral infection.  Abnormally shaped bones between the nasal passages.  Nasal growths that contain mucus (nasal polyps).  Narrow sinus openings.  Pollutants, such as chemicals or irritants in the air.  A foreign object stuck in the nose.  A fungal infection. This is rare.  What increases the risk? The following factors may make you more likely to develop this condition:  Having allergies or asthma.  Having had a recent cold or respiratory tract infection.  Having structural deformities or blockages in your nose or sinuses.  Having a weak immune system.  Doing a lot of swimming or  diving.  Overusing nasal sprays.  Smoking.  What are the signs or symptoms? The main symptoms of this condition are pain and a feeling of pressure around the affected sinuses. Other symptoms include:  Upper toothache.  Earache.  Headache.  Bad breath.  Decreased sense of smell and taste.  A cough that may get worse at night.  Fatigue.  Fever.  Thick drainage from your nose. The drainage is often green and it may contain pus (purulent).  Stuffy nose or congestion.  Postnasal drip. This is when extra mucus collects in the throat or back of the nose.  Swelling and warmth over the affected sinuses.  Sore throat.  Sensitivity to light.  How is this diagnosed? This condition is diagnosed based on symptoms, a medical history, and a physical exam. To find out if your condition is acute or chronic, your health care provider may:  Look in your nose for signs of nasal polyps.  Tap over the affected sinus to check for signs of infection.  View the inside of your sinuses using an imaging device that has a light attached (endoscope).  If your health care provider suspects that you have chronic sinusitis, you may also:  Be tested for allergies.  Have a sample of mucus taken from your nose (nasal culture) and checked for bacteria.  Have a mucus sample examined to see if your sinusitis is related to an allergy.  If your sinusitis does not respond to treatment and it lasts longer than 8 weeks, you may have an MRI or CT  scan to check your sinuses. These scans also help to determine how severe your infection is. In rare cases, a bone biopsy may be done to rule out more serious types of fungal sinus disease. How is this treated? Treatment for sinusitis depends on the cause and whether your condition is chronic or acute. If a virus is causing your sinusitis, your symptoms will go away on their own within 10 days. You may be given medicines to relieve your symptoms,  including:  Topical nasal decongestants. They shrink swollen nasal passages and let mucus drain from your sinuses.  Antihistamines. These drugs block inflammation that is triggered by allergies. This can help to ease swelling in your nose and sinuses.  Topical nasal corticosteroids. These are nasal sprays that ease inflammation and swelling in your nose and sinuses.  Nasal saline washes. These rinses can help to get rid of thick mucus in your nose.  If your condition is caused by bacteria, you will be given an antibiotic medicine. If your condition is caused by a fungus, you will be given an antifungal medicine. Surgery may be needed to correct underlying conditions, such as narrow nasal passages. Surgery may also be needed to remove polyps. Follow these instructions at home: Medicines  Take, use, or apply over-the-counter and prescription medicines only as told by your health care provider. These may include nasal sprays.  If you were prescribed an antibiotic medicine, take it as told by your health care provider. Do not stop taking the antibiotic even if you start to feel better. Hydrate and Humidify  Drink enough water to keep your urine clear or pale yellow. Staying hydrated will help to thin your mucus.  Use a cool mist humidifier to keep the humidity level in your home above 50%.  Inhale steam for 10-15 minutes, 3-4 times a day or as told by your health care provider. You can do this in the bathroom while a hot shower is running.  Limit your exposure to cool or dry air. Rest  Rest as much as possible.  Sleep with your head raised (elevated).  Make sure to get enough sleep each night. General instructions  Apply a warm, moist washcloth to your face 3-4 times a day or as told by your health care provider. This will help with discomfort.  Wash your hands often with soap and water to reduce your exposure to viruses and other germs. If soap and water are not available, use hand  sanitizer.  Do not smoke. Avoid being around people who are smoking (secondhand smoke).  Keep all follow-up visits as told by your health care provider. This is important. Contact a health care provider if:  You have a fever.  Your symptoms get worse.  Your symptoms do not improve within 10 days. Get help right away if:  You have a severe headache.  You have persistent vomiting.  You have pain or swelling around your face or eyes.  You have vision problems.  You develop confusion.  Your neck is stiff.  You have trouble breathing. This information is not intended to replace advice given to you by your health care provider. Make sure you discuss any questions you have with your health care provider. Document Released: 01/13/2005 Document Revised: 09/09/2015 Document Reviewed: 11/08/2014 Elsevier Interactive Patient Education  2017 [employer]  Cough, Adult Coughing is a reflex that clears your throat and your airways. Coughing helps to heal and protect your lungs. It is normal to cough occasionally, but a cough  that happens with other symptoms or lasts a long time may be a sign of a condition that needs treatment. A cough may last only 2-3 weeks (acute), or it may last longer than 8 weeks (chronic). What are the causes? Coughing is commonly caused by:  Breathing in substances that irritate your lungs.  A viral or bacterial respiratory infection.  Allergies.  Asthma.  Postnasal drip.  Smoking.  Acid backing up from the stomach into the esophagus (gastroesophageal reflux).  Certain medicines.  Chronic lung problems, including COPD (or rarely, lung cancer).  Other medical conditions such as heart failure.  Follow these instructions at home: Pay attention to any changes in your symptoms. Take these actions to help with your discomfort:  Take medicines only as told by your health care provider. ? If you were prescribed an antibiotic medicine, take it as told by  your health care provider. Do not stop taking the antibiotic even if you start to feel better. ? Talk with your health care provider before you take a cough suppressant medicine.  Drink enough fluid to keep your urine clear or pale yellow.  If the air is dry, use a cold steam vaporizer or humidifier in your bedroom or your home to help loosen secretions.  Avoid anything that causes you to cough at work or at home.  If your cough is worse at night, try sleeping in a semi-upright position.  Avoid cigarette smoke. If you smoke, quit smoking. If you need help quitting, ask your health care provider.  Avoid caffeine.  Avoid alcohol.  Rest as needed.  Contact a health care provider if:  You have new symptoms.  You cough up pus.  Your cough does not get better after 2-3 weeks, or your cough gets worse.  You cannot control your cough with suppressant medicines and you are losing sleep.  You develop pain that is getting worse or pain that is not controlled with pain medicines.  You have a fever.  You have unexplained weight loss.  You have night sweats. Get help right away if:  You cough up blood.  You have difficulty breathing.  Your heartbeat is very fast. This information is not intended to replace advice given to you by your health care provider. Make sure you discuss any questions you have with your health care provider. Document Released: 07/12/2010 Document Revised: 06/21/2015 Document Reviewed: 03/22/2014 Elsevier Interactive Patient Education  2017 [employer].      IF you received an x-ray today, you will receive an invoice from [facility]. Please contact [facility] at [phone] with questions or concerns regarding your invoice.   IF you received labwork today, you will receive an invoice from [place]. Please contact [facility] at [phone] with questions or concerns regarding your invoice.   Our billing staff will not be able to  assist you with questions regarding bills from these companies.  You will be contacted with the lab results as soon as they are available. The fastest way to get your results is to activate your My Chart account. Instructions are located on the last page of this paperwork. If you have not heard from [place] regarding the results in 2 weeks, please contact this office.

## 2016-12-22 ENCOUNTER — Encounter (INDEPENDENT_AMBULATORY_CARE_PROVIDER_SITE_OTHER): Payer: Self-pay | Admitting: Family

## 2016-12-22 ENCOUNTER — Other Ambulatory Visit: Payer: Self-pay | Admitting: Family Medicine

## 2016-12-22 DIAGNOSIS — J019 Acute sinusitis, unspecified: Secondary | ICD-10-CM

## 2016-12-22 NOTE — Telephone Encounter (Signed)
Patient states he is going out of town and the amoxacillin never cleared up his sinus infection from 10/26 he would like something else.   Do you want to see patient again?

## 2016-12-24 DIAGNOSIS — E7800 Pure hypercholesterolemia, unspecified: Secondary | ICD-10-CM | POA: Insufficient documentation

## 2016-12-24 MED ORDER — LEVOFLOXACIN 500 MG PO TABS: 500.0000 mg | ORAL_TABLET | Freq: Every day | ORAL | 0 refills | Status: DC

## 2016-12-24 NOTE — Telephone Encounter (Signed)
Previous treatment with Augmentin for sinusitis, some improvement initially, but never went all the way away. Now he has felt worse past 5-6 days. some chest congestion last night, no fever.  Discolored nasal discharge past 5-6 days.  Was seen in urgent care earlier today, was given prescription for doxycycline. He is not taking that medication yet.   Will treat for persistent/possibly resistant sinusitis with secondary thickening versus the possibility of second viral infection on top of previous sinusitis. Instead of doxycycline, Start Levaquin 500 mg daily for 10 days. Potential side effects discussed. RTC/ER precautions given

## 2016-12-30 ENCOUNTER — Telehealth: Payer: Self-pay | Admitting: Family Medicine

## 2016-12-30 NOTE — Telephone Encounter (Signed)
Copied from CRM [id number]. Topic: Quick Communication - Rx Refill/Question >> Dec 30, 2016 11:25 AM Darletta MollLander, Lumin L wrote: Has the patient contacted their pharmacy? Yes.   (Agent: If no, request that the patient contact the pharmacy for the refill.) Preferred Pharmacy (with phone number or street name): [place] Agent: Please be advised that RX refills may take up to 3 business days. We ask that you follow-up with your pharmacy. Forgot his script of levofloxacin (LEVAQUIN) 500 MG tablet at home. Headed to [place] and wants to know if there is a way to get more.

## 2016-12-30 NOTE — Telephone Encounter (Signed)
Pt forgot his script of Levaquin and wants to know if he could get another. Pt states [place] is headed [place]

## 2016-12-31 MED ORDER — LEVOFLOXACIN 500 MG PO TABS: 500.0000 mg | ORAL_TABLET | Freq: Every day | ORAL | 0 refills | Status: DC

## 2016-12-31 NOTE — Telephone Encounter (Signed)
Rx sent to [facility]. Let me know if it needed to be sent elsewhere. Thanks.

## 2017-10-22 ENCOUNTER — Other Ambulatory Visit: Payer: Self-pay | Admitting: Family Medicine

## 2017-10-22 DIAGNOSIS — E7800 Pure hypercholesterolemia, unspecified: Secondary | ICD-10-CM

## 2017-10-22 NOTE — Telephone Encounter (Signed)
Needs OV for more refills.  30 day supply given 10/22/17

## 2017-11-22 ENCOUNTER — Other Ambulatory Visit: Payer: Self-pay | Admitting: Family Medicine

## 2017-11-22 DIAGNOSIS — E7800 Pure hypercholesterolemia, unspecified: Secondary | ICD-10-CM

## 2017-11-23 NOTE — Telephone Encounter (Signed)
Patient called and advised to schedule an appointment for a physical, patient verbalized understanding. First available for physical is Tuesday, 01/05/18 at 1440, patient agrees to that appointment. I advised medication will be refilled until the appointment, he verbalized understanding.

## 2017-11-23 NOTE — Telephone Encounter (Signed)
Requested Prescriptions  Pending Prescriptions Disp Refills  . atorvastatin (LIPITOR) 20 MG tablet [Pharmacy Med Name: ATORVASTATIN 20 MG TABLET] 45 tablet 0    Sig: TAKE 1 TABLET (20 MG TOTAL) BY MOUTH DAILY AT 6 PM.     Cardiovascular:  Antilipid - Statins Failed - 11/23/2017  1:09 PM      Failed - Total Cholesterol in normal range and within 360 days    Cholesterol, Total  Date Value Ref Range Status  09/25/2016 164 100 - 199 mg/dL Final         Failed - LDL in normal range and within 360 days    LDL Calculated  Date Value Ref Range Status  09/25/2016 69 0 - 99 mg/dL Final         Failed - HDL in normal range and within 360 days    HDL  Date Value Ref Range Status  09/25/2016 42 >39 mg/dL Final         Failed - Triglycerides in normal range and within 360 days    Triglycerides  Date Value Ref Range Status  09/25/2016 267 (H) 0 - 149 mg/dL Final         Failed - Valid encounter within last 12 months    Recent Outpatient Visits          1 year ago Acute sinusitis, recurrence not specified, unspecified location   [facility], Asencion Partridge, MD   1 year ago Annual physical exam   [facility], Asencion Partridge, MD   1 year ago Wheezing   [facility], Sandria Bales, MD   1 year ago Thumb pain, right   [facility], Godfrey Pick, FNP   2 years ago Podagra   [facility], Asencion Partridge, MD      Future Appointments            In 1 month Neva Seat Asencion Partridge, MD [facility] [place], [facility]           Passed - Patient is not pregnant      Courtesy refill until appointment 01/05/18.

## 2017-12-15 ENCOUNTER — Other Ambulatory Visit: Payer: Self-pay | Admitting: Family Medicine

## 2017-12-15 DIAGNOSIS — E7800 Pure hypercholesterolemia, unspecified: Secondary | ICD-10-CM

## 2018-01-05 ENCOUNTER — Encounter: Payer: Self-pay | Admitting: Family Medicine

## 2018-01-05 ENCOUNTER — Ambulatory Visit (INDEPENDENT_AMBULATORY_CARE_PROVIDER_SITE_OTHER): Payer: 59 | Admitting: Family Medicine

## 2018-01-05 ENCOUNTER — Other Ambulatory Visit: Payer: Self-pay

## 2018-01-05 VITALS — BP 132/80 | HR 62 | Temp 98.5°F | Ht 74.0 in | Wt 223.8 lb

## 2018-01-05 DIAGNOSIS — R030 Elevated blood-pressure reading, without diagnosis of hypertension: Secondary | ICD-10-CM

## 2018-01-05 DIAGNOSIS — B360 Pityriasis versicolor: Secondary | ICD-10-CM

## 2018-01-05 DIAGNOSIS — Z0001 Encounter for general adult medical examination with abnormal findings: Secondary | ICD-10-CM | POA: Diagnosis not present

## 2018-01-05 DIAGNOSIS — Z862 Personal history of diseases of the blood and blood-forming organs and certain disorders involving the immune mechanism: Secondary | ICD-10-CM

## 2018-01-05 DIAGNOSIS — E785 Hyperlipidemia, unspecified: Secondary | ICD-10-CM | POA: Diagnosis not present

## 2018-01-05 DIAGNOSIS — Z0000 Encounter for general adult medical examination without abnormal findings: Secondary | ICD-10-CM

## 2018-01-05 DIAGNOSIS — Z23 Encounter for immunization: Secondary | ICD-10-CM

## 2018-01-05 DIAGNOSIS — Z136 Encounter for screening for cardiovascular disorders: Secondary | ICD-10-CM | POA: Diagnosis not present

## 2018-01-05 DIAGNOSIS — E7800 Pure hypercholesterolemia, unspecified: Secondary | ICD-10-CM

## 2018-01-05 DIAGNOSIS — R9431 Abnormal electrocardiogram [ECG] [EKG]: Secondary | ICD-10-CM

## 2018-01-05 DIAGNOSIS — M25522 Pain in left elbow: Secondary | ICD-10-CM

## 2018-01-05 LAB — POCT URINALYSIS DIP (MANUAL ENTRY)
BILIRUBIN UA: NEGATIVE
Blood, UA: NEGATIVE
Glucose, UA: NEGATIVE mg/dL
Ketones, POC UA: NEGATIVE mg/dL
LEUKOCYTES UA: NEGATIVE
NITRITE UA: NEGATIVE
PH UA: 5.5 (ref 5.0–8.0)
PROTEIN UA: NEGATIVE mg/dL
Spec Grav, UA: 1.015 (ref 1.010–1.025)
UROBILINOGEN UA: 0.2 U/dL

## 2018-01-05 MED ORDER — ATORVASTATIN CALCIUM 20 MG PO TABS: 20.0000 mg | ORAL_TABLET | Freq: Every day | ORAL | 2 refills | Status: DC

## 2018-01-05 MED ORDER — KETOCONAZOLE 200 MG PO TABS: 400.0000 mg | ORAL_TABLET | Freq: Every day | ORAL | 0 refills | Status: DC

## 2018-01-05 NOTE — Progress Notes (Signed)
Subjective:    Patient ID: Cory Mcfarland, male    DOB: [birth year], 42 y.o.   MRN: [id number]  HPI Cory Mcfarland is a 42 y.o. male Presents today for: Chief Complaint  Patient presents with  . Annual Exam    CPE (Not fasting)  . Medication Refill    lipator (90 day supply)   Hyperlipidemia: On Lipitor 20mg  qd. No missed doses recently, no new side effects  Not fasting today.    Lab Results  Component Value Date   CHOL 164 09/25/2016   HDL 42 09/25/2016   LDLCALC 69 09/25/2016   TRIG 267 (H) 09/25/2016   CHOLHDL 3.9 09/25/2016   Lab Results  Component Value Date   ALT 40 09/25/2016   AST 29 09/25/2016   ALKPHOS 49 09/25/2016   BILITOT 0.6 09/25/2016   Elevated BP BP Readings from Last 3 Encounters:  01/05/18 (!) 155/91  11/21/16 138/82  09/25/16 133/87  159/81 at [facility] 12/24/16.  Lab Results  Component Value Date   CREATININE 1.05 09/25/2016  outside readings , some above 140. Some below.  Sometimes feels pulse more, but no other new symptoms.  No associated chest pain, dyspnea, recurrent palpitations. Some intermittent stress at work - handling ok with exercise.   Cancer screen: FH of brain, lymph node, renal CA, deceased at 42yo.   Immunization History  Administered Date(s) Administered  . Influenza-Unspecified 11/28/2015  . Tdap 09/25/2016   Flu vaccine today.   Depression screen Depression screen [facility] 2/9 01/05/2018 11/21/2016 09/25/2016 07/24/2016 01/09/2016  Decreased Interest 0 0 0 0 0  Down, Depressed, Hopeless 0 0 0 0 0  PHQ - 2 Score 0 0 0 0 0   Declines STI testing. No new partners.   No exam data present Last seen in Spring - annually. No known issues.   Exercise: 5-6 days per week. No CP/dyspnea. Occasional dyspnea with stirs, but able to ride bike without difficulty.   Dentist: every 6 months.  Alcohol: 5-7 per week, up to 3 on weekends  No IDU, no energy drinks. 3-4 cups coffee per day.     Patient Active Problem List   Diagnosis Date Noted  . Avulsion fracture of lateral malleolus of right fibula, closed, initial encounter 08/18/2016  . Sprain of metacarpophalangeal joint of right thumb, initial encounter 02/18/2016   Past Medical History:  Diagnosis Date  . Anemia   . Ankle sprain   . Hyperlipidemia    Past Surgical History:  Procedure Laterality Date  . HERNIA REPAIR     No Known Allergies Prior to Admission medications   Medication Sig Start Date End Date Taking? Authorizing Provider  atorvastatin (LIPITOR) 20 MG tablet TAKE 1 TABLET (20 MG TOTAL) BY MOUTH DAILY AT 6 PM. 11/23/17  Yes Shade Flood, MD   Social History   Socioeconomic History  . Marital status: Married    Spouse name: Not on file  . Number of children: Not on file  . Years of education: Not on file  . Highest education level: Not on file  Occupational History  . Not on file  Social Needs  . Financial resource strain: Not on file  . Food insecurity:    Worry: Not on file    Inability: Not on file  . Transportation needs:    Medical: Not on file    Non-medical: Not on file  Tobacco Use  . Smoking status: Former Games developer  . Smokeless tobacco: Never Used  . Tobacco comment:  quit 12 years ago  Substance and Sexual Activity  . Alcohol use: Yes    Alcohol/week: 8.0 standard drinks    Types: 8 Glasses of wine per week  . Drug use: No  . Sexual activity: Yes  Lifestyle  . Physical activity:    Days per week: Not on file    Minutes per session: Not on file  . Stress: Not on file  Relationships  . Social connections:    Talks on phone: Not on file    Gets together: Not on file    Attends religious service: Not on file    Active member of club or organization: Not on file    Attends meetings of clubs or organizations: Not on file    Relationship status: Not on file  . Intimate partner violence:    Fear of current or ex partner: Not on file    Emotionally abused: Not on file    Physically abused: Not on file      Forced sexual activity: Not on file  Other Topics Concern  . Not on file  Social History Narrative  . Not on file    Review of Systems  Musculoskeletal: Positive for arthralgias (L elbow past few weeks, NKI. noticed soreness with riding bike. ).  Skin: Positive for rash (on chest, upper. there for a month. teledoc Rx shampoo past 3 weeks, no change. ).   13 point review of systems per patient health survey noted.  Negative other than as indicated above or in HPI.      Objective:   Physical Exam  Constitutional: He is oriented to person, place, and time. He appears well-developed and well-nourished.    HENT:  Head: Normocephalic and atraumatic.  Right Ear: External ear normal.  Left Ear: External ear normal.  Mouth/Throat: Oropharynx is clear and moist.  Eyes: Pupils are equal, round, and reactive to light. Conjunctivae and EOM are normal.  Neck: Normal range of motion. Neck supple. No thyromegaly present.  Cardiovascular: Normal rate, regular rhythm, normal heart sounds and intact distal pulses.  Pulmonary/Chest: Effort normal and breath sounds normal. No respiratory distress. He has no wheezes.  Abdominal: Soft. He exhibits no distension. There is no tenderness.  Genitourinary: Prostate normal.  Musculoskeletal: Normal range of motion. He exhibits no edema or tenderness.  Lymphadenopathy:    He has no cervical adenopathy.  Neurological: He is alert and oriented to person, place, and time. He has normal reflexes.  Skin: Skin is warm and dry.  Psychiatric: He has a normal mood and affect. His behavior is normal.  Vitals reviewed.  Vitals:   01/05/18 1505 01/05/18 1624  BP: (!) 155/91 132/80  Pulse: 62   Temp: 98.5 F (36.9 C)   TempSrc: Oral   SpO2: 98%   Weight: 223 lb 12.8 oz (101.5 kg)   Height: 6\' 2"  (1.88 m)    EKG: SR, rate 53, PR 238 - 1st degree block. Qin III, inverted T wave in III, AVF.   No recent EKG, thinks he had one in 2011 when possible  calcification on CCS on testing in [place].   Recommended starting statin then. Stress test reportedly ok at that time.    Results for orders placed or performed in visit on 01/05/18  POCT urinalysis dipstick  Result Value Ref Range   Color, UA straw (A) yellow   Clarity, UA clear clear   Glucose, UA negative negative mg/dL   Bilirubin, UA negative negative   Ketones, POC  UA negative negative mg/dL   Spec Grav, UA 4.540 9.811 - 1.025   Blood, UA negative negative   pH, UA 5.5 5.0 - 8.0   Protein Ur, POC negative negative mg/dL   Urobilinogen, UA 0.2 0.2 or 1.0 E.U./dL   Nitrite, UA Negative Negative   Leukocytes, UA Negative Negative       Assessment & Plan:   Cory Mcfarland is a 42 y.o. male Annual physical exam  - -anticipatory guidance as below in AVS, screening labs above. Health maintenance items as above in HPI discussed/recommended as applicable.   Hyperlipidemia, unspecified hyperlipidemia type - Plan: Lipid panel, Comprehensive metabolic panel Pure hypercholesterolemia - Plan: atorvastatin (LIPITOR) 20 MG tablet  -Check labs.  Continue Lipitor same dose for now.  History of anemia - Plan: CBC  Need for influenza vaccination - Plan: Flu Vaccine QUAD 36+ mos IM  Elevated blood pressure reading - Plan: POCT urinalysis dipstick, EKG 12-Lead Screening for cardiovascular condition - Plan: EKG 12-Lead Nonspecific abnormal electrocardiogram (ECG) (EKG) - Plan: Ambulatory referral to Cardiology  -No proteinuria on urinalysis.  EKG with some abnormalities.  Rare possible palpitation with feeling heartbeat, but otherwise asymptomatic.  Borderline blood pressure, improved on recheck.  -Refer to cardiology for further evaluation and EKG and decide on other testing.  ER precautions if symptomatic  Left elbow pain  -Reassuring exam at present.  Possible ulnar nerve irritation based on location.  Advised to avoid direct pressure to the elbow, can wrap elbow with towel at night in case  there may be a compressive neuropathy with the way he is sleeping, and RTC precautions if not improving.   Tinea versicolor - Plan: ketoconazole (NIZORAL) 200 MG tablet  -Nizoral 400 mg x 1.  Dosing instructions discussed, RTC precaution   Meds ordered this encounter  Medications  . ketoconazole (NIZORAL) 200 MG tablet    Sig: Take 2 tablets (400 mg total) by mouth daily.    Dispense:  2 tablet    Refill:  0  . atorvastatin (LIPITOR) 20 MG tablet    Sig: Take 1 tablet (20 mg total) by mouth daily at 6 PM.    Dispense:  90 tablet    Refill:  2   Patient Instructions   Chest rash appears to be tinea versicolor.  Take Nizoral pills both at once, exercise afterwards as we discussed.  Do not take Lipitor on the day you are taking Nizoral.  Elbow exam is overall reassuring at this time.  Try to avoid direct pressure on elbow, can wrap elbow with towel at bedtime to see if you might be bending elbow upwards with sleeping, but if not improving in the next few weeks, return for recheck.  Sooner if worse.  No change in Lipitor dose at this time.  I will check some other screening labs as well.  Keep a record of your blood pressures outside of the office and bring them to the next office visit.    I will refer you to cardiology to evaluate the EKG and some of the episodic symptoms of shortness of breath.  They can evaluate blood pressure as well there, or I am happy to see you in the next month or 2 if those readings are elevated outside of the office.  Thank you for coming in today.  Let me know if there are any questions in the meantime.  Tinea Versicolor Tinea versicolor is a common fungal infection of the skin. It causes a rash that appears  as light or dark patches on the skin. The rash most often occurs on the chest, back, neck, or upper arms. This condition is more common during warm weather. Other than affecting how your skin looks, tinea versicolor usually does not cause other problems.  In most cases, the infection goes away in a few weeks with treatment. It may take a few months for the patches on your skin to clear up. What are the causes? Tinea versicolor occurs when a type of fungus that is normally present on the skin starts to overgrow. This fungus is a kind of yeast. The exact cause of the overgrowth is not known. This condition cannot be passed from one person to another (noncontagious). What increases the risk? This condition is more likely to develop when certain factors are present, such as:  Heat and humidity.  Sweating too much.  Hormone changes.  Oily skin.  A weak defense (immune) system.  What are the signs or symptoms? Symptoms of this condition may include:  A rash on your skin that is made up of light or dark patches. The rash may have: ? Patches of tan or pink spots on light skin. ? Patches of white or brown spots on dark skin. ? Patches of skin that do not tan. ? Well-marked edges. ? Scales on the discolored areas.  Mild itching.  How is this diagnosed? A health care provider can usually diagnose this condition by looking at your skin. During the exam, he or she may use ultraviolet light to help determine the extent of the infection. In some cases, a skin sample may be taken by scraping the rash. This sample will be viewed under a microscope to check for yeast overgrowth. How is this treated? Treatment for this condition may include:  Dandruff shampoo that is applied to the affected skin during showers or bathing.  Over-the-counter medicated skin cream, lotion, or soaps.  Prescription antifungal medicine in the form of skin cream or pills.  Medicine to help reduce itching.  Follow these instructions at home:  Take medicines only as directed by your health care provider.  Apply dandruff shampoo to the affected area if told to do so by your health care provider. You may be instructed to scrub the affected skin for several minutes each  day.  Do not scratch the affected area of skin.  Avoid hot and humid conditions.  Do not use tanning booths.  Try to avoid sweating a lot. Contact a health care provider if:  Your symptoms get worse.  You have a fever.  You have redness, swelling, or pain at the site of your rash.  You have fluid, blood, or pus coming from your rash.  Your rash returns after treatment. This information is not intended to replace advice given to you by your health care provider. Make sure you discuss any questions you have with your health care provider. Document Released: 01/11/2000 Document Revised: 09/16/2015 Document Reviewed: 10/25/2013 Elsevier Interactive Patient Education  2018 [employer] Keeping you healthy  Get these tests  Blood pressure- Have your blood pressure checked once a year by your healthcare provider.  Normal blood pressure is 120/80.  Weight- Have your body mass index (BMI) calculated to screen for obesity.  BMI is a measure of body fat based on height and weight. You can also calculate your own BMI at https://www.west-esparza.com/www.nhlbisupport.com/bmi/.  Cholesterol- Have your cholesterol checked regularly starting at age 42, sooner may be necessary if you have diabetes, high blood pressure,  if a family member developed heart diseases at an early age or if you smoke.   Chlamydia, HIV, and other sexual transmitted disease- Get screened each year until the age of 55 then within three months of each new sexual partner.  Diabetes- Have your blood sugar checked regularly if you have high blood pressure, high cholesterol, a family history of diabetes or if you are overweight.  Get these vaccines  Flu shot- Every fall.  Tetanus shot- Every 10 years.  Menactra- Single dose; prevents meningitis.  Take these steps  Don't smoke- If you do smoke, ask your healthcare provider about quitting. For tips on how to quit, go to www.smokefree.gov or call 1-800-QUIT-NOW.  Be physically active- Exercise 5  days a week for at least 30 minutes.  If you are not already physically active start slow and gradually work up to 30 minutes of moderate physical activity.  Examples of moderate activity include walking briskly, mowing the yard, dancing, swimming bicycling, etc.  Eat a healthy diet- Eat a variety of healthy foods such as fruits, vegetables, low fat milk, low fat cheese, yogurt, lean meats, poultry, fish, beans, tofu, etc.  For more information on healthy eating, go to www.thenutritionsource.org  Drink alcohol in moderation- Limit alcohol intake two drinks or less a day.  Never drink and drive.  Dentist- Brush and floss teeth twice daily; visit your dentis twice a year.  Depression-Your emotional health is as important as your physical health.  If you're feeling down, losing interest in things you normally enjoy please talk with your healthcare provider.  Gun Safety- If you keep a gun in your home, keep it unloaded and with the safety lock on.  Bullets should be stored separately.  Helmet use- Always wear a helmet when riding a motorcycle, bicycle, rollerblading or skateboarding.  Safe sex- If you may be exposed to a sexually transmitted infection, use a condom  Seat belts- Seat bels can save your life; always wear one.  Smoke/Carbon Monoxide detectors- These detectors need to be installed on the appropriate level of your home.  Replace batteries at least once a year.  Skin Cancer- When out in the sun, cover up and use sunscreen SPF 15 or higher.  Violence- If anyone is threatening or hurting you, please tell your healthcare provider.     If you have lab work done today you will be contacted with your lab results within the next 2 weeks.  If you have not heard from [place] then please contact us. The fastest way to get your results is to register for My Chart.   IF you received an x-ray today, you will receive an invoice from [facility]. Please contact [facility] at  [phone] with questions or concerns regarding your invoice.   IF you received labwork today, you will receive an invoice from [place]. Please contact [facility] at [phone] with questions or concerns regarding your invoice.   Our billing staff will not be able to assist you with questions regarding bills from these companies.  You will be contacted with the lab results as soon as they are available. The fastest way to get your results is to activate your My Chart account. Instructions are located on the last page of this paperwork. If you have not heard from [place] regarding the results in 2 weeks, please contact this office.       Signed,   Meredith Staggers, MD [facility].  01/09/18 3:23 PM

## 2018-01-05 NOTE — Patient Instructions (Addendum)
Chest rash appears to be tinea versicolor.  Take Nizoral pills both at once, exercise afterwards as we discussed.  Do not take Lipitor on the day you are taking Nizoral.  Elbow exam is overall reassuring at this time.  Try to avoid direct pressure on elbow, can wrap elbow with towel at bedtime to see if you might be bending elbow upwards with sleeping, but if not improving in the next few weeks, return for recheck.  Sooner if worse.  No change in Lipitor dose at this time.  I will check some other screening labs as well.  Keep a record of your blood pressures outside of the office and bring them to the next office visit.    I will refer you to cardiology to evaluate the EKG and some of the episodic symptoms of shortness of breath.  They can evaluate blood pressure as well there, or I am happy to see you in the next month or 2 if those readings are elevated outside of the office.  Thank you for coming in today.  Let me know if there are any questions in the meantime.  Tinea Versicolor Tinea versicolor is a common fungal infection of the skin. It causes a rash that appears as light or dark patches on the skin. The rash most often occurs on the chest, back, neck, or upper arms. This condition is more common during warm weather. Other than affecting how your skin looks, tinea versicolor usually does not cause other problems. In most cases, the infection goes away in a few weeks with treatment. It may take a few months for the patches on your skin to clear up. What are the causes? Tinea versicolor occurs when a type of fungus that is normally present on the skin starts to overgrow. This fungus is a kind of yeast. The exact cause of the overgrowth is not known. This condition cannot be passed from one person to another (noncontagious). What increases the risk? This condition is more likely to develop when certain factors are present, such as:  Heat and humidity.  Sweating too much.  Hormone  changes.  Oily skin.  A weak defense (immune) system.  What are the signs or symptoms? Symptoms of this condition may include:  A rash on your skin that is made up of light or dark patches. The rash may have: ? Patches of tan or pink spots on light skin. ? Patches of white or brown spots on dark skin. ? Patches of skin that do not tan. ? Well-marked edges. ? Scales on the discolored areas.  Mild itching.  How is this diagnosed? A health care provider can usually diagnose this condition by looking at your skin. During the exam, he or she may use ultraviolet light to help determine the extent of the infection. In some cases, a skin sample may be taken by scraping the rash. This sample will be viewed under a microscope to check for yeast overgrowth. How is this treated? Treatment for this condition may include:  Dandruff shampoo that is applied to the affected skin during showers or bathing.  Over-the-counter medicated skin cream, lotion, or soaps.  Prescription antifungal medicine in the form of skin cream or pills.  Medicine to help reduce itching.  Follow these instructions at home:  Take medicines only as directed by your health care provider.  Apply dandruff shampoo to the affected area if told to do so by your health care provider. You may be instructed to scrub the  affected skin for several minutes each day.  Do not scratch the affected area of skin.  Avoid hot and humid conditions.  Do not use tanning booths.  Try to avoid sweating a lot. Contact a health care provider if:  Your symptoms get worse.  You have a fever.  You have redness, swelling, or pain at the site of your rash.  You have fluid, blood, or pus coming from your rash.  Your rash returns after treatment. This information is not intended to replace advice given to you by your health care provider. Make sure you discuss any questions you have with your health care provider. Document Released:  01/11/2000 Document Revised: 09/16/2015 Document Reviewed: 10/25/2013 Elsevier Interactive Patient Education  2018 [employer]. Keeping you healthy  Get these tests  Blood pressure- Have your blood pressure checked once a year by your healthcare provider.  Normal blood pressure is 120/80.  Weight- Have your body mass index (BMI) calculated to screen for obesity.  BMI is a measure of body fat based on height and weight. You can also calculate your own BMI at https://www.west-esparza.com/.  Cholesterol- Have your cholesterol checked regularly starting at age 76, sooner may be necessary if you have diabetes, high blood pressure, if a family member developed heart diseases at an early age or if you smoke.   Chlamydia, HIV, and other sexual transmitted disease- Get screened each year until the age of 63 then within three months of each new sexual partner.  Diabetes- Have your blood sugar checked regularly if you have high blood pressure, high cholesterol, a family history of diabetes or if you are overweight.  Get these vaccines  Flu shot- Every fall.  Tetanus shot- Every 10 years.  Menactra- Single dose; prevents meningitis.  Take these steps  Don't smoke- If you do smoke, ask your healthcare provider about quitting. For tips on how to quit, go to www.smokefree.gov or call 1-800-QUIT-NOW.  Be physically active- Exercise 5 days a week for at least 30 minutes.  If you are not already physically active start slow and gradually work up to 30 minutes of moderate physical activity.  Examples of moderate activity include walking briskly, mowing the yard, dancing, swimming bicycling, etc.  Eat a healthy diet- Eat a variety of healthy foods such as fruits, vegetables, low fat milk, low fat cheese, yogurt, lean meats, poultry, fish, beans, tofu, etc.  For more information on healthy eating, go to www.thenutritionsource.org  Drink alcohol in moderation- Limit alcohol intake two drinks or less a day.   Never drink and drive.  Dentist- Brush and floss teeth twice daily; visit your dentis twice a year.  Depression-Your emotional health is as important as your physical health.  If you're feeling down, losing interest in things you normally enjoy please talk with your healthcare provider.  Gun Safety- If you keep a gun in your home, keep it unloaded and with the safety lock on.  Bullets should be stored separately.  Helmet use- Always wear a helmet when riding a motorcycle, bicycle, rollerblading or skateboarding.  Safe sex- If you may be exposed to a sexually transmitted infection, use a condom  Seat belts- Seat bels can save your life; always wear one.  Smoke/Carbon Monoxide detectors- These detectors need to be installed on the appropriate level of your home.  Replace batteries at least once a year.  Skin Cancer- When out in the sun, cover up and use sunscreen SPF 15 or higher.  Violence- If anyone is threatening  or hurting you, please tell your healthcare provider.     If you have lab work done today you will be contacted with your lab results within the next 2 weeks.  If you have not heard from [place] then please contact us. The fastest way to get your results is to register for My Chart.   IF you received an x-ray today, you will receive an invoice from [facility]. Please contact [facility] at [phone] with questions or concerns regarding your invoice.   IF you received labwork today, you will receive an invoice from [place]. Please contact [facility] at [phone] with questions or concerns regarding your invoice.   Our billing staff will not be able to assist you with questions regarding bills from these companies.  You will be contacted with the lab results as soon as they are available. The fastest way to get your results is to activate your My Chart account. Instructions are located on the last page of this paperwork. If you have not heard from [place]  regarding the results in 2 weeks, please contact this office.

## 2018-01-08 ENCOUNTER — Ambulatory Visit (INDEPENDENT_AMBULATORY_CARE_PROVIDER_SITE_OTHER): Payer: 59 | Admitting: Family Medicine

## 2018-01-08 DIAGNOSIS — Z862 Personal history of diseases of the blood and blood-forming organs and certain disorders involving the immune mechanism: Secondary | ICD-10-CM

## 2018-01-08 DIAGNOSIS — E785 Hyperlipidemia, unspecified: Secondary | ICD-10-CM

## 2018-01-09 LAB — CBC
Hematocrit: 41.6 % (ref 37.5–51.0)
Hemoglobin: 13.4 g/dL (ref 13.0–17.7)
MCH: 25.8 pg — ABNORMAL LOW (ref 26.6–33.0)
MCHC: 32.2 g/dL (ref 31.5–35.7)
MCV: 80 fL (ref 79–97)
Platelets: 251 10*3/uL (ref 150–450)
RBC: 5.2 x10E6/uL (ref 4.14–5.80)
RDW: 13.7 % (ref 12.3–15.4)
WBC: 3.7 10*3/uL (ref 3.4–10.8)

## 2018-01-09 LAB — COMPREHENSIVE METABOLIC PANEL
ALT: 28 IU/L (ref 0–44)
AST: 19 IU/L (ref 0–40)
Albumin/Globulin Ratio: 1.8 (ref 1.2–2.2)
Albumin: 4.5 g/dL (ref 3.5–5.5)
Alkaline Phosphatase: 45 IU/L (ref 39–117)
BILIRUBIN TOTAL: 0.5 mg/dL (ref 0.0–1.2)
BUN/Creatinine Ratio: 14 (ref 9–20)
BUN: 14 mg/dL (ref 6–24)
CO2: 23 mmol/L (ref 20–29)
Calcium: 9.3 mg/dL (ref 8.7–10.2)
Chloride: 106 mmol/L (ref 96–106)
Creatinine, Ser: 1.02 mg/dL (ref 0.76–1.27)
GFR calc Af Amer: 104 mL/min/{1.73_m2} (ref >59)
GFR calc non Af Amer: 90 mL/min/{1.73_m2} (ref >59)
Globulin, Total: 2.5 g/dL (ref 1.5–4.5)
Glucose: 99 mg/dL (ref 65–99)
Potassium: 4.6 mmol/L (ref 3.5–5.2)
Sodium: 142 mmol/L (ref 134–144)
Total Protein: 7 g/dL (ref 6.0–8.5)

## 2018-01-09 LAB — LIPID PANEL
Chol/HDL Ratio: 4.3 ratio (ref 0.0–5.0)
Cholesterol, Total: 165 mg/dL (ref 100–199)
HDL: 38 mg/dL — ABNORMAL LOW (ref >39)
LDL Calculated: 98 mg/dL (ref 0–99)
TRIGLYCERIDES: 144 mg/dL (ref 0–149)
VLDL Cholesterol Cal: 29 mg/dL (ref 5–40)

## 2018-01-10 NOTE — Progress Notes (Signed)
Nurse only visit.  Patient not seen

## 2018-02-01 ENCOUNTER — Encounter: Payer: Self-pay | Admitting: Family Medicine

## 2018-02-03 NOTE — Progress Notes (Signed)
Cardiology Office Note   Date:  02/05/2018   ID:  Cory LeanGregory Shupert, DOB 1975-12-21, MRN [id number]  PCP:  Shade FloodGreene, Jeffrey R, MD  Cardiologist:   Charlton HawsPeter Nishan, MD   No chief complaint on file.     History of Present Illness: Cory Mcfarland is a 43 y.o. male who presents for consultation regarding abnormal ECG and HTN.  Reviewed office note from [facility] Rx for HLD with lipitor. BP elevated but not placed on Rx. Some rare palpitations and exertional dyspnea No chest pain. 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 Some atypical chest pains when he Gets anxious about his heart     Past Medical History:  Diagnosis Date  . Anemia   . Ankle sprain   . Hyperlipidemia     Past Surgical History:  Procedure Laterality Date  . HERNIA REPAIR       Current Outpatient Medications  Medication Sig Dispense Refill  . atorvastatin (LIPITOR) 20 MG tablet Take 1 tablet (20 mg total) by mouth daily at 6 PM. 90 tablet 2  . ketoconazole (NIZORAL) 200 MG tablet Take 2 tablets (400 mg total) by mouth daily. 2 tablet 0  . losartan (COZAAR) 50 MG tablet Take 1 tablet (50 mg total) by mouth daily. 90 tablet 3   No current facility-administered medications for this visit.     Allergies:   Patient has no known allergies.    Social History:  The patient  reports that he has quit smoking. He has never used smokeless tobacco. He reports current alcohol use of about 8.0 standard drinks of alcohol per week. He reports that he does not use drugs.   Family History:   The patient's family history includes Cancer in his father, maternal grandfather, and maternal grandmother; Diabetes in his paternal grandfather; Hyperlipidemia in his mother.    ROS:  Please see the history of present illness.   Otherwise, review of systems are positive for none.   All other systems are reviewed and negative.    PHYSICAL EXAM: VS:  BP (!) 150/88   Pulse 86   Ht 6\' 2"  (1.88 m)   Wt 226 lb 8 oz (102.7 kg)   SpO2 99%   BMI 29.08 kg/m  , BMI Body mass index is 29.08 kg/m. Affect appropriate Healthy:  appears stated age HEENT: normal Neck supple with no adenopathy JVP normal no bruits no thyromegaly Lungs clear with no wheezing and good diaphragmatic motion Heart:  S1/S2 no murmur, no rub, gallop or click PMI normal Abdomen: benighn, BS positve, no tenderness, no AAA no bruit.  No HSM or HJR Distal pulses intact with no bruits No edema Neuro non-focal Skin warm and dry No muscular weakness    EKG:  See HPI   Recent Labs: 01/08/2018: ALT 28; BUN 14; Creatinine, Ser 1.02; Hemoglobin 13.4; Platelets 251; Potassium 4.6; Sodium 142    Lipid Panel    Component Value Date/Time   CHOL 165 01/08/2018 1057  TRIG 144 01/08/2018 1057   HDL 38 (L) 01/08/2018 1057   CHOLHDL 4.3 01/08/2018 1057   CHOLHDL 4.6 04/05/2015 0816   VLDL 43 (H) 04/05/2015 0816   LDLCALC 98 01/08/2018 1057      Wt Readings from Last 3 Encounters:  02/05/18 226 lb 8 oz (102.7 kg)  01/05/18 223 lb 12.8 oz (101.5 kg)  11/21/16 238 lb (108 kg)      Other studies Reviewed: Additional studies/ records that were reviewed today include: Notes .    ASSESSMENT AND PLAN:  1. Abnormal ECG: likely related to HTN given SSCP will order exercise Stress echo to assess for structural heart dx LVH and r/o CAD. Will Also see what BP response to exercise is 2. HTN:  Start cozaar 50 mg daily BMET in 3 weeks f/u with me 6-8 weeks 3. HLD:  Continue statin labs with primary LDL 98 01/08/18   Advised him To have coronary calcium score to assess 5 year risk of MI and manage Intensity of statin RX   Current medicines are reviewed at length with the patient today.  The patient does not have concerns regarding medicines.  The following changes have been made:  Cozaar 50 mg   Labs/ tests ordered today include: Calcium score Stress Echo  Orders Placed This Encounter  Procedures  . CT CARDIAC SCORING  . Basic metabolic panel  . ECHOCARDIOGRAM STRESS TEST     Disposition:   FU with cardiology PRN      Signed, Charlton Haws, MD  02/05/2018 10:10 AM    [facility] [address] [place], [place], [place]  [address] Phone: [phone]; Fax: [phone]

## 2018-02-05 ENCOUNTER — Ambulatory Visit (INDEPENDENT_AMBULATORY_CARE_PROVIDER_SITE_OTHER): Payer: 59 | Admitting: Cardiovascular Disease

## 2018-02-05 ENCOUNTER — Encounter: Payer: Self-pay | Admitting: Cardiovascular Disease

## 2018-02-05 VITALS — BP 150/88 | HR 86 | Ht 74.0 in | Wt 226.5 lb

## 2018-02-05 DIAGNOSIS — E785 Hyperlipidemia, unspecified: Secondary | ICD-10-CM

## 2018-02-05 DIAGNOSIS — I10 Essential (primary) hypertension: Secondary | ICD-10-CM | POA: Diagnosis not present

## 2018-02-05 DIAGNOSIS — R9431 Abnormal electrocardiogram [ECG] [EKG]: Secondary | ICD-10-CM

## 2018-02-05 MED ORDER — LOSARTAN POTASSIUM 50 MG PO TABS: 50.0000 mg | ORAL_TABLET | Freq: Every day | ORAL | 3 refills | Status: DC

## 2018-02-05 NOTE — Patient Instructions (Addendum)
Medication Instructions:  Your physician has recommended you make the following change in your medication:  1-START Losartan 50 mg by mouth daily.  If you need a refill on your cardiac medications before your next appointment, please call your pharmacy.   Lab work: Your physician recommends that you return for lab work in: 3 weeks for BMET  If you have labs (blood work) drawn today and your tests are completely normal, you will receive your results only by: Marland Kitchen MyChart Message (if you have MyChart) OR . A paper copy in the mail If you have any lab test that is abnormal or we need to change your treatment, we will call you to review the results.  Testing/Procedures: Your physician has requested that you have a stress echocardiogram. For further information please visit [email or web address] Please follow instruction sheet as given.  Cardiac CT scanning- Calcium Score, (CAT scanning), is a noninvasive, special x-ray that produces cross-sectional images of the body using x-rays and a computer. CT scans help physicians diagnose and treat medical conditions. For some CT exams, a contrast material is used to enhance visibility in the area of the body being studied. CT scans provide greater clarity and reveal more details than regular x-ray exams.  Follow-Up: At [facility], you and your health needs are our priority.  As part of our continuing mission to provide you with exceptional heart care, we have created designated Provider Care Teams.  These Care Teams include your primary Cardiologist (physician) and Advanced Practice Providers (APPs -  Physician Assistants and Nurse Practitioners) who all work together to provide you with the care you need, when you need it. You will need a follow up appointment in 6 to 8 weeks.  You may see Dr. Eden Emms or one of the following Advanced Practice Providers on your designated Care Team:   Norma Fredrickson, NP Nada Boozer, NP . Georgie Chard, NP     Exercise  Stress Echocardiogram  An exercise stress echocardiogram is a test that checks how well your heart is working. For this test, you will walk on a treadmill to make your heart beat faster. This test uses sound waves (ultrasound) and a computer to make pictures (images) of your heart. These pictures will be taken before you exercise and after you exercise. What happens before the procedure?  Follow instructions from your doctor about what you cannot eat or drink before the test.  Do not drink or eat anything that has caffeine in it. Stop having caffeine for 24 hours before the test.  Ask your doctor about changing or stopping your normal medicines. This is important if you take diabetes medicines or blood thinners. Ask your doctor if you should take your medicines with water before the test.  If you use an inhaler, bring it to the test.  Do not use any products that have nicotine or tobacco in them, such as cigarettes and e-cigarettes. Stop using them for 4 hours before the test. If you need help quitting, ask your doctor.  Wear comfortable shoes and clothing. What happens during the procedure?  You will be hooked up to a TV screen. Your doctor will watch the screen to see how fast your heart beats during the test.  Before you exercise, a computer will make a picture of your heart. To do this: ? A gel will be put on your chest. ? A wand will be moved over the gel. ? Sound waves from the wand will go to the computer to make  the picture.  Your will start walking on a treadmill. The treadmill will start at a slow speed. It will get faster a little bit at a time. When you walk faster, your heart will beat faster.  The treadmill will be stopped when your heart is working hard.  You will lie down right away so another picture of your heart can be taken.  The test will take 30-60 minutes. What happens after the procedure?  Your heart rate and blood pressure will be watched after the  test.  If your doctor says that you can, you may: ? Eat what you usually eat. ? Do your normal activities. ? Take medicines like normal. Summary  An exercise stress echocardiogram is a test that checks how well your heart is working.  Follow instructions about what you cannot eat or drink before the test. Ask your doctor if you should take your normal medicines before the test.  Stop having caffeine for 24 hours before the test. Do not use anything with nicotine or tobacco in it for 4 hours before the test.  A computer will take a picture of your heart before you walk on a treadmill. It will take another picture when you are done walking.  Your heart rate and blood pressure will be watched after the test. This information is not intended to replace advice given to you by your health care provider. Make sure you discuss any questions you have with your health care provider. Document Released: 11/10/2008 Document Revised: 10/07/2015 Document Reviewed: 10/07/2015 Elsevier Interactive Patient Education  2019 Elsevier Inc. Losartan tablets What is this medicine? LOSARTAN (loe SAR tan) is used to treat high blood pressure and to reduce the risk of stroke in certain patients. This drug also slows the progression of kidney disease in patients with diabetes. This medicine may be used for other purposes; ask your health care provider or pharmacist if you have questions. COMMON BRAND NAME(S): Cozaar What should I tell my health care provider before I take this medicine? They need to know if you have any of these conditions: -heart failure -kidney or liver disease -an unusual or allergic reaction to losartan, other medicines, foods, dyes, or preservatives -pregnant or trying to get pregnant -breast-feeding How should I use this medicine? Take this medicine by mouth with a glass of water. Follow the directions on the prescription label. This medicine can be taken with or without food. Take your  doses at regular intervals. Do not take your medicine more often than directed. Talk to your pediatrician regarding the use of this medicine in children. Special care may be needed. Overdosage: If you think you have taken too much of this medicine contact a poison control center or emergency room at once. NOTE: This medicine is only for you. Do not share this medicine with others. What if I miss a dose? If you miss a dose, take it as soon as you can. If it is almost time for your next dose, take only that dose. Do not take double or extra doses. What may interact with this medicine? -blood pressure medicines -diuretics, especially triamterene, spironolactone, or amiloride -fluconazole -NSAIDs, medicines for pain and inflammation, like ibuprofen or naproxen -potassium salts or potassium supplements -rifampin This list may not describe all possible interactions. Give your health care provider a list of all the medicines, herbs, non-prescription drugs, or dietary supplements you use. Also tell them if you smoke, drink alcohol, or use illegal drugs. Some items may interact with your  medicine. What should I watch for while using this medicine? Visit your doctor or health care professional for regular checks on your progress. Check your blood pressure as directed. Ask your doctor or health care professional what your blood pressure should be and when you should contact him or her. Call your doctor or health care professional if you notice an irregular or fast heart beat. Women should inform their doctor if they wish to become pregnant or think they might be pregnant. There is a potential for serious side effects to an unborn child, particularly in the second or third trimester. Talk to your health care professional or pharmacist for more information. You may get drowsy or dizzy. Do not drive, use machinery, or do anything that needs mental alertness until you know how this drug affects you. Do not stand  or sit up quickly, especially if you are an older patient. This reduces the risk of dizzy or fainting spells. Alcohol can make you more drowsy and dizzy. Avoid alcoholic drinks. Avoid salt substitutes unless you are told otherwise by your doctor or health care professional. Do not treat yourself for coughs, colds, or pain while you are taking this medicine without asking your doctor or health care professional for advice. Some ingredients may increase your blood pressure. What side effects may I notice from receiving this medicine? Side effects that you should report to your doctor or health care professional as soon as possible: -confusion, dizziness, light headedness or fainting spells -decreased amount of urine passed -difficulty breathing or swallowing, hoarseness, or tightening of the throat -fast or irregular heart beat, palpitations, or chest pain -skin rash, itching -swelling of your face, lips, tongue, hands, or feet Side effects that usually do not require medical attention (report to your doctor or health care professional if they continue or are bothersome): -cough -decreased sexual function or desire -headache -nasal congestion or stuffiness -nausea or stomach pain -sore or cramping muscles This list may not describe all possible side effects. Call your doctor for medical advice about side effects. You may report side effects to FDA at 1-800-FDA-1088. Where should I keep my medicine? Keep out of the reach of children. Store at room temperature between 15 and 30 degrees C (59 and 86 degrees F). Protect from light. Keep container tightly closed. Throw away any unused medicine after the expiration date. NOTE: This sheet is a summary. It may not cover all possible information. If you have questions about this medicine, talk to your doctor, pharmacist, or health care provider.  2019 Elsevier/Gold Standard (2007-03-26 16:42:18)

## 2018-02-08 ENCOUNTER — Other Ambulatory Visit: Payer: Self-pay

## 2018-02-08 ENCOUNTER — Encounter: Payer: Self-pay | Admitting: Family Medicine

## 2018-02-08 DIAGNOSIS — E785 Hyperlipidemia, unspecified: Secondary | ICD-10-CM

## 2018-02-08 DIAGNOSIS — R9431 Abnormal electrocardiogram [ECG] [EKG]: Secondary | ICD-10-CM

## 2018-02-08 DIAGNOSIS — I10 Essential (primary) hypertension: Secondary | ICD-10-CM

## 2018-02-08 MED ORDER — LOSARTAN POTASSIUM 100 MG PO TABS: 50.0000 mg | ORAL_TABLET | Freq: Every day | ORAL | 3 refills | Status: DC

## 2018-02-08 NOTE — Progress Notes (Signed)
[facility] is out of losartan 50 mg tablets, but they do have losartan 100 mg that patient can cut in half.

## 2018-02-09 ENCOUNTER — Telehealth (HOSPITAL_COMMUNITY): Payer: Self-pay

## 2018-02-09 NOTE — Telephone Encounter (Signed)
Detailed instructions left on the patient's AM. Asked to call back with any questions. S.Shamia Uppal EMTP

## 2018-02-11 ENCOUNTER — Ambulatory Visit (HOSPITAL_COMMUNITY): Payer: 59 | Attending: Cardiovascular Disease

## 2018-02-11 ENCOUNTER — Ambulatory Visit (INDEPENDENT_AMBULATORY_CARE_PROVIDER_SITE_OTHER)
Admission: RE | Admit: 2018-02-11 | Discharge: 2018-02-11 | Disposition: A | Discharge status: Home / Self Care | Payer: 59 | Source: Ambulatory Visit | Attending: Cardiovascular Disease | Admitting: Cardiovascular Disease

## 2018-02-11 ENCOUNTER — Ambulatory Visit (HOSPITAL_COMMUNITY): Payer: 59

## 2018-02-11 DIAGNOSIS — I10 Essential (primary) hypertension: Secondary | ICD-10-CM | POA: Diagnosis present

## 2018-02-11 DIAGNOSIS — R9431 Abnormal electrocardiogram [ECG] [EKG]: Secondary | ICD-10-CM | POA: Diagnosis present

## 2018-02-11 DIAGNOSIS — E785 Hyperlipidemia, unspecified: Secondary | ICD-10-CM | POA: Diagnosis present

## 2018-02-12 ENCOUNTER — Telehealth: Payer: Self-pay

## 2018-02-12 DIAGNOSIS — E785 Hyperlipidemia, unspecified: Secondary | ICD-10-CM

## 2018-02-12 DIAGNOSIS — E7800 Pure hypercholesterolemia, unspecified: Secondary | ICD-10-CM

## 2018-02-12 MED ORDER — ATORVASTATIN CALCIUM 40 MG PO TABS: 40.0000 mg | ORAL_TABLET | Freq: Every day | ORAL | 3 refills | Status: DC

## 2018-02-12 NOTE — Telephone Encounter (Signed)
-----   Message from Wendall StadePeter C Nishan, MD sent at 02/12/2018  9:21 AM EST ----- Calcium score high for age increase lipitor to 40 mg daily target LDL 70 or less f/u lipid/liver in 3 months stress echo pending

## 2018-02-12 NOTE — Telephone Encounter (Signed)
Patient aware of calcium score results. Calcium score high for age increase lipitor to 40 mg daily target LDL 70 or less f/u lipid/liver in 3 months. Patient will come in on 05/19/18 for lab work.

## 2018-02-17 ENCOUNTER — Ambulatory Visit: Payer: 59 | Admitting: Family Medicine

## 2018-03-14 NOTE — Progress Notes (Deleted)
Cardiology Office Note   Date:  03/14/2018   ID:  Cory Mcfarland, DOB 09-02-1975, MRN [id number]  PCP:  Shade Flood, MD  Cardiologist:   Charlton Haws, MD   No chief complaint on file.     History of Present Illness:  43 y.o. f/u HTN, Abnormal ECG and high calcium score.  LDL 98 in December and lipitor dose increased Calcium score 178 including LM/LAD 97 th percentile for age. Had f/u stress echo that was normal 02/11/18  Some rare palpitations and exertional dyspnea No chest pain. 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   Lipitor dose increased January and started on Cozaar 50 mg daily    ***    Past Medical History:  Diagnosis Date  . Anemia   . Ankle sprain   . Hyperlipidemia     Past Surgical History:  Procedure Laterality Date  . HERNIA REPAIR       Current Outpatient Medications  Medication Sig Dispense Refill  . atorvastatin (LIPITOR) 40 MG tablet Take 1 tablet (40 mg total) by mouth daily at 6 PM. 90 tablet 3  . ketoconazole (NIZORAL) 200 MG tablet Take 2 tablets (400 mg total) by mouth daily. 2 tablet 0  . losartan (COZAAR) 100 MG tablet Take 0.5 tablets (50 mg total) by mouth daily. 45 tablet 3   No current facility-administered medications for this visit.     Allergies:   Patient has no known allergies.    Social History:  The patient  reports that he has quit smoking. He has never used smokeless tobacco. He reports current alcohol use of about 8.0 standard drinks of alcohol per week. He reports that he does not use  drugs.   Family History:  The patient's family history includes Cancer in his father, maternal grandfather, and maternal grandmother; Diabetes in his paternal grandfather; Hyperlipidemia in his mother.    ROS:  Please see the history of present illness.   Otherwise, review of systems are positive for none.   All other systems are reviewed and negative.    PHYSICAL EXAM: VS:  There were no vitals taken for this visit. , BMI There is no height or weight on file to calculate BMI. Affect appropriate Healthy:  appears stated age HEENT: normal Neck supple with no adenopathy JVP normal no bruits no thyromegaly Lungs clear with no wheezing and good diaphragmatic motion Heart:  S1/S2 no murmur, no rub, gallop or click PMI normal Abdomen: benighn, BS positve, no tenderness, no AAA no bruit.  No HSM or HJR Distal pulses intact with no bruits No edema Neuro non-focal Skin warm and dry No muscular weakness    EKG:  See HPI   Recent Labs: 01/08/2018: ALT 28; BUN 14; Creatinine, Ser 1.02; Hemoglobin 13.4; Platelets 251; Potassium 4.6; Sodium 142    Lipid Panel    Component Value Date/Time   CHOL 165 01/08/2018 1057   TRIG 144 01/08/2018 1057   HDL 38 (L) 01/08/2018 1057  CHOLHDL 4.3 01/08/2018 1057   CHOLHDL 4.6 04/05/2015 0816   VLDL 43 (H) 04/05/2015 0816   LDLCALC 98 01/08/2018 1057      Wt Readings from Last 3 Encounters:  02/05/18 102.7 kg  01/05/18 101.5 kg  11/21/16 108 kg      Other studies Reviewed: Additional studies/ records that were reviewed today include: Notes .    ASSESSMENT AND PLAN:  1. Abnormal ECG: likely related to HTN Had normal stress echo 02/11/18   2. HTN:  Improved with Cozaar ***  3. HLD:  Continue statin  LDL 98 01/08/18  Dose increased January due to calcium score 178 which is 48 th percentile for age  Including LM/LAD atherosclerosis Target less than 70 Repeat LDL in March    Current medicines are reviewed at length with the patient  today.  The patient does not have concerns regarding medicines.  The following changes have been made:   ***  Labs/ tests ordered today include: ***  Lipid and liver in march   No orders of the defined types were placed in this encounter.    Disposition:   FU with cardiology in a year     Signed, Charlton Haws, MD  03/14/2018 1:43 PM    [facility] [address] [place], [place], [place]  [address] Phone: [phone]; Fax: [phone]

## 2018-03-19 ENCOUNTER — Ambulatory Visit: Payer: 59 | Admitting: Cardiovascular Disease

## 2018-03-31 ENCOUNTER — Ambulatory Visit: Payer: 59 | Admitting: Cardiovascular Disease

## 2018-04-02 NOTE — Progress Notes (Deleted)
Cardiology Office Note   Date:  04/02/2018   ID:  Cory Mcfarland, DOB 1975-08-11, MRN [id number]  PCP:  Shade Flood, MD  Cardiologist:   Charlton Haws, MD   No chief complaint on file.     History of Present Illness:  43 y.o. f/u HTN, Abnormal ECG and high calcium score.  LDL 98 in December and lipitor dose increased Calcium score 178 including LM/LAD 97 th percentile for age. Had f/u stress echo that was normal 02/11/18  Some rare palpitations and exertional dyspnea No chest pain. 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   Lipitor dose increased January and started on Cozaar 50 mg daily    ***    Past Medical History:  Diagnosis Date  . Anemia   . Ankle sprain   . Hyperlipidemia     Past Surgical History:  Procedure Laterality Date  . HERNIA REPAIR       Current Outpatient Medications  Medication Sig Dispense Refill  . atorvastatin (LIPITOR) 40 MG tablet Take 1 tablet (40 mg total) by mouth daily at 6 PM. 90 tablet 3  . ketoconazole (NIZORAL) 200 MG tablet Take 2 tablets (400 mg total) by mouth daily. 2 tablet 0  . losartan (COZAAR) 100 MG tablet Take 0.5 tablets (50 mg total) by mouth daily. 45 tablet 3   No current facility-administered medications for this visit.     Allergies:   Patient has no known allergies.    Social History:  The patient  reports that he has quit smoking. He has never used smokeless tobacco. He reports current alcohol use of about 8.0 standard drinks of alcohol per week. He reports that he does not use  drugs.   Family History:  The patient's family history includes Cancer in his father, maternal grandfather, and maternal grandmother; Diabetes in his paternal grandfather; Hyperlipidemia in his mother.    ROS:  Please see the history of present illness.   Otherwise, review of systems are positive for none.   All other systems are reviewed and negative.    PHYSICAL EXAM: VS:  There were no vitals taken for this visit. , BMI There is no height or weight on file to calculate BMI. Affect appropriate Healthy:  appears stated age HEENT: normal Neck supple with no adenopathy JVP normal no bruits no thyromegaly Lungs clear with no wheezing and good diaphragmatic motion Heart:  S1/S2 no murmur, no rub, gallop or click PMI normal Abdomen: benighn, BS positve, no tenderness, no AAA no bruit.  No HSM or HJR Distal pulses intact with no bruits No edema Neuro non-focal Skin warm and dry No muscular weakness    EKG:  See HPI   Recent Labs: 01/08/2018: ALT 28; BUN 14; Creatinine, Ser 1.02; Hemoglobin 13.4; Platelets 251; Potassium 4.6; Sodium 142    Lipid Panel    Component Value Date/Time   CHOL 165 01/08/2018 1057   TRIG 144 01/08/2018 1057   HDL 38 (L) 01/08/2018 1057  CHOLHDL 4.3 01/08/2018 1057   CHOLHDL 4.6 04/05/2015 0816   VLDL 43 (H) 04/05/2015 0816   LDLCALC 98 01/08/2018 1057      Wt Readings from Last 3 Encounters:  02/05/18 102.7 kg  01/05/18 101.5 kg  11/21/16 108 kg      Other studies Reviewed: Additional studies/ records that were reviewed today include: Notes .    ASSESSMENT AND PLAN:  1. Abnormal ECG: likely related to HTN Had normal stress echo 02/11/18   2. HTN:  Improved with Cozaar ***  3. HLD:  Continue statin  LDL 98 01/08/18  Dose increased January due to calcium score 178 which is 6 th percentile for age  Including LM/LAD atherosclerosis Target less than 70 Repeat LDL in March    Current medicines are reviewed at length with the patient  today.  The patient does not have concerns regarding medicines.  The following changes have been made:   ***  Labs/ tests ordered today include: ***  Lipid and liver in march   No orders of the defined types were placed in this encounter.    Disposition:   FU with cardiology in a year     Signed, Charlton Haws, MD  04/02/2018 3:10 PM    [facility] [address] [place], [place], [place]  [address] Phone: [phone]; Fax: [phone]

## 2018-04-13 ENCOUNTER — Telehealth: Payer: Self-pay

## 2018-04-13 NOTE — Telephone Encounter (Signed)
Patient called and rescheduled with scheduling.

## 2018-04-13 NOTE — Telephone Encounter (Signed)
Left message for patient to call back. Need to cancel appointment, due to COVID19 precautions. Will need to reschedule at a later date.   

## 2018-04-15 ENCOUNTER — Ambulatory Visit: Payer: 59 | Admitting: Cardiovascular Disease

## 2018-05-07 ENCOUNTER — Telehealth: Payer: Self-pay

## 2018-05-07 NOTE — Telephone Encounter (Signed)
YOUR CARDIOLOGY TEAM HAS ARRANGED FOR AN E-VISIT FOR YOUR APPOINTMENT - PLEASE REVIEW IMPORTANT INFORMATION BELOW SEVERAL DAYS PRIOR TO YOUR APPOINTMENT  Due to the recent COVID-19 pandemic, we are transitioning in-person office visits to tele-medicine visits in an effort to decrease unnecessary exposure to our patients and staff. Medicare and most insurances are covering these visits without a copay needed. We also encourage you to sign up for MyChart if you have not already done so. You will need a smartphone if possible. For patients that do not have this, we can still complete the visit using a regular telephone but do prefer a smartphone to enable video when possible. You may have a close family member that lives with you that can help. If possible, we also ask that you have a blood pressure cuff and scale at home to measure your blood pressure, heart rate and weight prior to your scheduled appointment. Patients with clinical needs that need an in-person evaluation and testing will still be able to come to the office if absolutely necessary. If you have any questions, feel free to call our office.   2-3 DAYS BEFORE YOUR APPOINTMENT   We will remind you check your blood pressure, heart rate and weight prior to your scheduled appointment. If you have an Apple Watch or Kardia, please upload any pertinent ECG strips the day before or morning of your appointment to MyChart. Our staff will also make sure you have reviewed the consent and agree to move forward with your scheduled tele-health visit.     THE DAY OF YOUR APPOINTMENT  Approximately 15 minutes prior to your scheduled appointment, you will receive a telephone call from one of [facility] team - your caller ID may say "Unknown caller."  Our staff will confirm medications, vital signs for the day and any symptoms you may be experiencing. Please have this information available prior to the time of visit start. It may also be helpful for you to have  a pad of paper and pen handy for any instructions given during your visit. They will also walk you through joining the WebEx smartphone meeting if this is a video visit.    CONSENT FOR TELE-HEALTH VISIT - PLEASE REVIEW  I hereby voluntarily request, consent and authorize [facility] and its employed or contracted physicians, physician assistants, nurse practitioners or other licensed health care professionals (the Practitioner), to provide me with telemedicine health care services (the "Services") as deemed necessary by the treating Practitioner. I acknowledge and consent to receive the Services by the Practitioner via telemedicine. I understand that the telemedicine visit will involve communicating with the Practitioner through live audiovisual communication technology and the disclosure of certain medical information by electronic transmission. I acknowledge that I have been given the opportunity to request an in-person assessment or other available alternative prior to the telemedicine visit and am voluntarily participating in the telemedicine visit.  I understand that I have the right to withhold or withdraw my consent to the use of telemedicine in the course of my care at any time, without affecting my right to future care or treatment, and that the Practitioner or I may terminate the telemedicine visit at any time. I understand that I have the right to inspect all information obtained and/or recorded in the course of the telemedicine visit and may receive copies of available information for a reasonable fee.  I understand that some of the potential risks of receiving the Services via telemedicine include:  . Delay or interruption   in medical evaluation due to technological equipment failure or disruption; . Information transmitted may not be sufficient (e.g. poor resolution of images) to allow for appropriate medical decision making by the Practitioner; and/or  . In rare instances, security  protocols could fail, causing a breach of personal health information.  Furthermore, I acknowledge that it is my responsibility to provide information about my medical history, conditions and care that is complete and accurate to the best of my ability. I acknowledge that Practitioner's advice, recommendations, and/or decision may be based on factors not within their control, such as incomplete or inaccurate data provided by me or distortions of diagnostic images or specimens that may result from electronic transmissions. I understand that the practice of medicine is not an exact science and that Practitioner makes no warranties or guarantees regarding treatment outcomes. I acknowledge that I will receive a copy of this consent concurrently upon execution via email to the email address I last provided but may also request a printed copy by calling the office of [facility].    I understand that my insurance will be billed for this visit.   I have read or had this consent read to me. . I understand the contents of this consent, which adequately explains the benefits and risks of the Services being provided via telemedicine.  . I have been provided ample opportunity to ask questions regarding this consent and the Services and have had my questions answered to my satisfaction. . I give my informed consent for the services to be provided through the use of telemedicine in my medical care  By participating in this telemedicine visit I agree to the above.  

## 2018-05-12 NOTE — Progress Notes (Signed)
Virtual Visit via Video Note   This visit type was conducted due to national recommendations for restrictions regarding the COVID-19 Pandemic (e.g. social distancing) in an effort to limit this patient's exposure and mitigate transmission in our community.  Due to his co-morbid illnesses, this patient is at least at moderate risk for complications without adequate follow up.  This format is felt to be most appropriate for this patient at this time.  All issues noted in this document were discussed and addressed.  A limited physical exam was performed with this format.  Please refer to the patient's chart for his consent to telehealth for [facility].   Evaluation Performed:  Follow-up visit  Date:  05/12/2018   ID:  Cory Mcfarland, DOB 20-Aug-1975, MRN [id number]  Patient Location: Home Provider Location: Office  PCP:  Shade Flood, MD  Cardiologist:  Charlton Haws, MD   Electrophysiologist:  None   Chief Complaint:  Abnormal ECG/HTN/HLD  History of Present Illness:    Cory Mcfarland is a 43 y.o. male first seen 02/05/18 for abnormal ECG and atypical chest pain  Rx for HLD with lipitor. BP elevated but not placed on Rx. 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 Some atypical chest pains when he Gets anxious about his heart   Normal stress echo 02/11/18 reviewed  Calcium Score 02/12/18 178 in distal LM and proximal LAD 97 th percentile for age and sex  Lipitor increased to 40 mg daily LDL was 98 on lower dose needs f/u labs   No recurrent chest pain   The patient does not have  symptoms concerning for COVID-19 infection (fever, chills, cough, or new shortness of breath).    Past Medical History:  Diagnosis Date  . Anemia   . Ankle sprain   . Hyperlipidemia    Past Surgical History:  Procedure Laterality Date  . HERNIA REPAIR       No outpatient medications have been marked as taking for the 05/14/18 encounter (Appointment) with Wendall Stade, MD.     Allergies:   Patient has no known allergies.   Social History   Tobacco Use  . Smoking status: Former Games developer  . Smokeless tobacco: Never Used  . Tobacco comment: quit 12 years ago  Substance Use Topics  . Alcohol use: Yes    Alcohol/week: 8.0 standard drinks    Types: 8 Glasses of wine per week  . Drug use: No     Family Hx: The patient's family history includes Cancer in his father, maternal grandfather, and maternal grandmother; Diabetes in his paternal grandfather; Hyperlipidemia in his mother.  ROS:   Please see the history of present illness.     All other systems reviewed and are negative.   Prior CV studies:   The following studies were reviewed today:  Stress echo 01/2018 Calcium score 01/2018  Labs/Other Tests and Data Reviewed:    EKG: SR rate 53 PR 253 normal 01/05/18  Recent Labs: 01/08/2018: ALT 28; BUN 14; Creatinine, Ser 1.02; Hemoglobin 13.4; Platelets 251; Potassium 4.6; Sodium  142   Recent Lipid Panel Lab Results  Component Value Date/Time   CHOL 165 01/08/2018 10:57 AM   TRIG 144 01/08/2018 10:57 AM   HDL 38 (L) 01/08/2018 10:57 AM   CHOLHDL 4.3 01/08/2018 10:57 AM   CHOLHDL 4.6 04/05/2015 08:16 AM   LDLCALC 98 01/08/2018 10:57 AM    Wt Readings from Last 3 Encounters:  02/05/18 102.7 kg  01/05/18 101.5 kg  11/21/16 108 kg     Objective:    Vital Signs:  There were no vitals taken for this visit.   Well nourished, well developed male in no acute distress. Skin warm and dry No tachypnea No JVP elevation No edema   ASSESSMENT & PLAN:    1. Chest  Pain:  Normal stress echo 02/11/18 CAD on calcium score continue statin  2. HTN:  Well controlled.  Continue current medications and low sodium Dash type diet.   3. HLD:  Needs f/u labs on higher dose statin target LDL less than 75 given above average coronary calcium score   COVID-19 Education: The signs and symptoms of COVID-19 were discussed with the patient and how to seek care for testing (follow up with PCP or arrange E-visit).  The importance of social distancing was discussed today.  Time:   Today, I have spent 30 minutes with the patient with telehealth technology discussing the above problems.     Medication Adjustments/Labs and Tests Ordered: Current medicines are reviewed at length with the patient today.  Concerns regarding medicines are outlined above.   Tests Ordered: No orders of the defined types were placed in this encounter.   Medication Changes: No orders of the defined types were placed in this encounter.   Disposition:  Follow up in a year  Signed, Charlton Hawseter Terresa Marlett, MD  05/12/2018 4:40 PM     [facility]

## 2018-05-14 ENCOUNTER — Encounter: Payer: Self-pay | Admitting: Cardiovascular Disease

## 2018-05-14 ENCOUNTER — Telehealth (INDEPENDENT_AMBULATORY_CARE_PROVIDER_SITE_OTHER): Payer: 59 | Admitting: Cardiovascular Disease

## 2018-05-14 ENCOUNTER — Other Ambulatory Visit: Payer: Self-pay

## 2018-05-14 VITALS — Ht 74.0 in | Wt 207.5 lb

## 2018-05-14 DIAGNOSIS — R079 Chest pain, unspecified: Secondary | ICD-10-CM | POA: Diagnosis not present

## 2018-05-14 DIAGNOSIS — I10 Essential (primary) hypertension: Secondary | ICD-10-CM | POA: Diagnosis not present

## 2018-05-14 DIAGNOSIS — E785 Hyperlipidemia, unspecified: Secondary | ICD-10-CM | POA: Diagnosis not present

## 2018-05-14 NOTE — Patient Instructions (Addendum)
Medication Instructions:   If you need a refill on your cardiac medications before your next appointment, please call your pharmacy.   Lab work: Your physician recommends that you return for lab work in: 4 weeks for fasting lipid and liver panel.  If you have labs (blood work) drawn today and your tests are completely normal, you will receive your results only by: Marland Kitchen MyChart Message (if you have MyChart) OR . A paper copy in the mail If you have any lab test that is abnormal or we need to change your treatment, we will call you to review the results.  Testing/Procedures: None ordered today.  Follow-Up: At [facility], you and your health needs are our priority.  As part of our continuing mission to provide you with exceptional heart care, we have created designated Provider Care Teams.  These Care Teams include your primary Cardiologist (physician) and Advanced Practice Providers (APPs -  Physician Assistants and Nurse Practitioners) who all work together to provide you with the care you need, when you need it. You will need a follow up appointment in 12 months.  Please call our office 2 months in advance to schedule this appointment.  You may see Charlton Haws, MD or one of the following Advanced Practice Providers on your designated Care Team:   Norma Fredrickson, NP Nada Boozer, NP . Georgie Chard, NP   Someone from our office will call you with the lab appointment. Remember you need to be fasting for this lab appointment. Please do not eat or drink 8 hours prior to appointment.

## 2018-05-19 ENCOUNTER — Other Ambulatory Visit: Payer: 59

## 2018-05-31 ENCOUNTER — Telehealth: Payer: Self-pay

## 2018-05-31 NOTE — Telephone Encounter (Signed)
Called patient back with Dr. Fabio Bering recommendations. Made patient an appointment in August.

## 2018-05-31 NOTE — Telephone Encounter (Signed)
-----   Message from Wendall Stade, MD sent at 05/31/2018 12:34 PM EDT ----- Needs f/u for BP with me in 3 months

## 2018-06-14 ENCOUNTER — Other Ambulatory Visit: Payer: 59

## 2018-07-23 ENCOUNTER — Other Ambulatory Visit: Payer: 59 | Admitting: *Deleted

## 2018-07-23 ENCOUNTER — Other Ambulatory Visit: Payer: Self-pay

## 2018-07-23 DIAGNOSIS — E785 Hyperlipidemia, unspecified: Secondary | ICD-10-CM

## 2018-07-23 LAB — LIPID PANEL
Chol/HDL Ratio: 3.4 ratio (ref 0.0–5.0)
Cholesterol, Total: 151 mg/dL (ref 100–199)
HDL: 45 mg/dL (ref >39)
LDL Calculated: 75 mg/dL (ref 0–99)
Triglycerides: 154 mg/dL — ABNORMAL HIGH (ref 0–149)
VLDL Cholesterol Cal: 31 mg/dL (ref 5–40)

## 2018-07-23 LAB — HEPATIC FUNCTION PANEL
ALT: 32 IU/L (ref 0–44)
AST: 24 IU/L (ref 0–40)
Albumin: 4.7 g/dL (ref 4.0–5.0)
Alkaline Phosphatase: 48 IU/L (ref 39–117)
Bilirubin Total: 0.6 mg/dL (ref 0.0–1.2)
Bilirubin, Direct: 0.16 mg/dL (ref 0.00–0.40)
Total Protein: 7.3 g/dL (ref 6.0–8.5)

## 2018-09-07 ENCOUNTER — Telehealth: Payer: Self-pay

## 2018-09-07 NOTE — Telephone Encounter (Signed)
Called pt to switch pt's office visit with Dr. Johnsie Cancel on 8/19 to a virtual visit

## 2018-09-09 ENCOUNTER — Telehealth: Payer: Self-pay

## 2018-09-09 NOTE — Telephone Encounter (Signed)

## 2018-09-13 NOTE — Progress Notes (Signed)
Virtual Visit via Video Note   This visit type was conducted due to national recommendations for restrictions regarding the COVID-19 Pandemic (e.g. social distancing) in an effort to limit this patient's exposure and mitigate transmission in our community.  Due to his co-morbid illnesses, this patient is at least at moderate risk for complications without adequate follow up.  This format is felt to be most appropriate for this patient at this time.  All issues noted in this document were discussed and addressed.  A limited physical exam was performed with this format.  Please refer to the patient's chart for his consent to [facility].   Evaluation Performed:  Follow-up visit  Date:  09/15/2018   ID:  Cory Mcfarland, DOB 06-Nov-1975, MRN [id number]  Patient Location: Home Provider Location: Office  PCP:  Shade FloodGreene, Jeffrey R, MD  Cardiologist:  Charlton HawsPeter Dalton Mille, MD   Electrophysiologist:  None   Chief Complaint:  Abnormal ECG/HTN/HLD  History of Present Illness:    Cory Mcfarland is a 43 y.o. male first seen 02/05/18 for abnormal ECG and atypical chest pain  Rx for HLD with lipitor. BP elevated but not placed on Rx. 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    Past Medical History:  Diagnosis Date  . Anemia   . Ankle sprain   . Hyperlipidemia    Past Surgical History:  Procedure Laterality Date  . HERNIA REPAIR       Current Meds  Medication Sig  . atorvastatin (LIPITOR) 40 MG tablet Take 1 tablet (40 mg total) by mouth daily at 6 PM.  . losartan (COZAAR) 100 MG tablet Take 0.5 tablets (50 mg total) by mouth daily.     Allergies:   Patient has no known allergies.   Social History   Tobacco Use  . Smoking status: Former Games developermoker  . Smokeless tobacco: Never Used  . Tobacco comment: quit 12 years ago  Substance Use Topics  . Alcohol use: Yes    Alcohol/week: 8.0 standard drinks    Types: 8 Glasses of wine per week  . Drug use: No     Family Hx: The patient's family history includes Cancer in his father, maternal grandfather, and maternal grandmother; Diabetes in his paternal grandfather; Hyperlipidemia in his mother.  ROS:   Please see the history of present illness.     All other systems reviewed and are negative.   Prior CV studies:   The following studies were reviewed today:  Stress echo 01/2018 Calcium score 01/2018  Labs/Other Tests and Data Reviewed:    EKG: SR rate 53 PR 253 normal 01/05/18  Recent Labs: 01/08/2018: BUN 14; Creatinine, Ser 1.02; Hemoglobin 13.4; Platelets 251; Potassium 4.6; Sodium 142 07/23/2018: ALT 32   Recent Lipid Panel Lab Results  Component Value Date/Time   CHOL 151 07/23/2018 10:18 AM   TRIG 154 (H) 07/23/2018 10:18 AM   HDL 45 07/23/2018 10:18 AM   CHOLHDL 3.4 07/23/2018 10:18 AM   CHOLHDL 4.6 04/05/2015 08:16 AM   LDLCALC 75 07/23/2018 10:18 AM    Wt Readings from Last 3 Encounters:  09/15/18 210 lb (95.3 kg)  05/14/18 207 lb 8 oz (94.1 kg)  02/05/18 226 lb 8 oz (102.7 kg)     Objective:     Vital Signs:  BP (!) 132/91   Pulse 69   Ht 6\' 2"  (1.88 m)   Wt 210 lb (95.3 kg)   BMI 26.96 kg/m    Well nourished, well developed male in no acute distress. Skin warm and dry No tachypnea No JVP elevation No edema   ASSESSMENT & PLAN:    1. Chest Pain:  Normal stress echo 02/11/18 Calcium score high for age 41 97 th percentile and involving distal LM and LAD continue ASA statin  2. HTN:  Well controlled.  Continue current medications and low sodium Dash type diet.   3. HLD:  LDL 75 on 07/23/18  Continue higher dose lipitor 40 mg daily    COVID-19 Education: The signs and symptoms of COVID-19 were discussed with the patient and how to seek care for testing (follow up with PCP or arrange E-visit).  The importance of social distancing was discussed today.  Time:   Today, I have spent 30 minutes with the patient with telehealth technology discussing the above problems.     Medication Adjustments/Labs and Tests Ordered: Current medicines are reviewed at length with the patient today.  Concerns regarding medicines are outlined above.   Tests Ordered: No orders of the defined types were placed in this encounter.   Medication Changes: No orders of the defined types were placed in this encounter.   Disposition:  Follow up in a year  Signed, Jenkins Rouge, MD  09/15/2018 9:09 AM    [place]

## 2018-09-15 ENCOUNTER — Telehealth (INDEPENDENT_AMBULATORY_CARE_PROVIDER_SITE_OTHER): Payer: 59 | Admitting: Cardiovascular Disease

## 2018-09-15 ENCOUNTER — Encounter: Payer: Self-pay | Admitting: Cardiovascular Disease

## 2018-09-15 ENCOUNTER — Other Ambulatory Visit: Payer: Self-pay

## 2018-09-15 VITALS — BP 132/91 | HR 69 | Ht 74.0 in | Wt 210.0 lb

## 2018-09-15 DIAGNOSIS — I10 Essential (primary) hypertension: Secondary | ICD-10-CM

## 2018-09-15 DIAGNOSIS — R079 Chest pain, unspecified: Secondary | ICD-10-CM | POA: Diagnosis not present

## 2018-09-15 DIAGNOSIS — E785 Hyperlipidemia, unspecified: Secondary | ICD-10-CM

## 2018-09-15 NOTE — Patient Instructions (Signed)
Your physician recommends that you continue on your current medications as directed. Please refer to the Current Medication list given to you today.  Your physician wants you to follow-up in: YEAR WITH DR NISHAN You will receive a reminder letter in the mail two months in advance. If you don't receive a letter, please call our office to schedule the follow-up appointment.  

## 2018-09-24 ENCOUNTER — Other Ambulatory Visit: Payer: Self-pay | Admitting: Cardiovascular Disease

## 2018-09-24 DIAGNOSIS — I10 Essential (primary) hypertension: Secondary | ICD-10-CM

## 2018-09-24 DIAGNOSIS — R9431 Abnormal electrocardiogram [ECG] [EKG]: Secondary | ICD-10-CM

## 2018-09-24 DIAGNOSIS — E785 Hyperlipidemia, unspecified: Secondary | ICD-10-CM

## 2018-09-24 DIAGNOSIS — E7800 Pure hypercholesterolemia, unspecified: Secondary | ICD-10-CM

## 2018-09-24 MED ORDER — LOSARTAN POTASSIUM 100 MG PO TABS: 50.0000 mg | ORAL_TABLET | Freq: Every day | ORAL | 3 refills | Status: DC

## 2018-09-24 MED ORDER — ATORVASTATIN CALCIUM 40 MG PO TABS: 40.0000 mg | ORAL_TABLET | Freq: Every day | ORAL | 3 refills | Status: DC

## 2019-06-09 NOTE — Progress Notes (Signed)
Evaluation Performed:  Follow-up visit  Date:  06/17/2019   ID:  Cory Mcfarland, DOB December 04, 1975, MRN [id number]  PCP:  Wendie Agreste, MD  Cardiologist:  Jenkins Rouge, MD   Electrophysiologist:  None   Chief Complaint:  Abnormal ECG/HTN/HLD  History of Present Illness:    Cory Mcfarland is a 44 y.o. male first seen 02/05/18 for abnormal ECG and atypical chest pain  Rx for HLD with lipitor. BP elevated but not placed on Rx. 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    Past Medical History:  Diagnosis Date  . Anemia   . Ankle sprain   . Hyperlipidemia    Past Surgical History:  Procedure Laterality Date  . HERNIA REPAIR       Current Meds  Medication Sig  . atorvastatin (LIPITOR) 40 MG tablet Take 1 tablet (40 mg total) by mouth daily at 6 PM.  . losartan (COZAAR) 100 MG tablet Take 1 tablet (100 mg total) by mouth daily.  . [DISCONTINUED] losartan (COZAAR) 100 MG tablet Take 0.5 tablets (50 mg total) by mouth daily.      Allergies:   Patient has no known allergies.   Social History   Tobacco Use  . Smoking status: Former Research scientist (life sciences)  . Smokeless tobacco: Never Used  . Tobacco comment: quit 12 years ago  Substance Use Topics  . Alcohol use: Yes    Alcohol/week: 8.0 standard drinks    Types: 8 Glasses of wine per week  . Drug use: No     Family Hx: The patient's family history includes Cancer in his father, maternal grandfather, and maternal grandmother; Diabetes in his paternal grandfather; Hyperlipidemia in his mother.  ROS:   Please see the history of present illness.     All other systems reviewed and are negative.   Prior CV studies:   The following studies were reviewed today:  Stress echo 01/2018 Calcium score 01/2018  Labs/Other Tests and Data Reviewed:    EKG: SR rate 53 PR 253 normal 01/05/18 06/17/19 SR rate 61 inferior lateral T wave changes   Recent Labs: 07/23/2018: ALT 32   Recent Lipid Panel Lab Results  Component Value Date/Time   CHOL 151 07/23/2018 10:18 AM   TRIG 154 (H) 07/23/2018 10:18 AM   HDL 45 07/23/2018 10:18 AM   CHOLHDL 3.4 07/23/2018 10:18 AM   CHOLHDL 4.6 04/05/2015 08:16 AM   LDLCALC 75 07/23/2018 10:18 AM    Wt  Readings from Last 3 Encounters:  06/17/19 219 lb (99.3 kg)  09/15/18 210 lb (95.3 kg)  05/14/18 207 lb 8 oz (94.1 kg)     Objective:    Vital Signs:  BP 132/88   Pulse 61   Ht 6\' 2"  (1.88 m)   Wt 219 lb (99.3 kg)   SpO2 100%   BMI 28.12 kg/m    Affect appropriate Healthy:  appears stated age HEENT: normal Neck supple with no adenopathy JVP normal no bruits no thyromegaly Lungs clear with no wheezing and good diaphragmatic motion Heart:  S1/S2 no murmur, no rub, gallop or click PMI normal Abdomen: benighn, BS positve, no tenderness, no AAA no bruit.  No HSM or HJR Distal pulses intact with no bruits No edema Neuro non-focal Skin warm and dry No muscular weakness   ASSESSMENT & PLAN:    1. Chest Pain:  Normal stress  echo 02/11/18 Calcium score high for age 63 3 th percentile and involving distal LM and LAD continue ASA statin  2. HTN:  Well controlled.  Continue current medications and low sodium Dash type diet.   3. HLD:  LDL 75 on 07/23/18  Continue higher dose lipitor 40 mg daily    COVID-19 Education: The signs and symptoms of COVID-19 were discussed with the patient and how to seek care for testing (follow up with PCP or arrange E-visit).  The importance of social distancing was discussed today.  Time:   Today, I have spent 30 minutes with the patient with telehealth technology discussing the above problems.     Medication Adjustments/Labs and Tests Ordered: Current medicines are reviewed at length with the patient today.  Concerns regarding medicines are outlined above.   Tests Ordered: Orders Placed This Encounter  Procedures  . EKG 12-Lead    Medication Changes: Meds ordered this encounter  Medications  . losartan (COZAAR) 100 MG tablet    Sig: Take 1 tablet (100 mg total) by mouth daily.    Dispense:  90 tablet    Refill:  3    Disposition:  Follow up in a year  Signed, 07/25/18, MD  06/17/2019 2:42 PM    [facility]

## 2019-06-17 ENCOUNTER — Ambulatory Visit (INDEPENDENT_AMBULATORY_CARE_PROVIDER_SITE_OTHER): Payer: 59 | Admitting: Cardiovascular Disease

## 2019-06-17 ENCOUNTER — Encounter: Payer: Self-pay | Admitting: Cardiovascular Disease

## 2019-06-17 ENCOUNTER — Other Ambulatory Visit: Payer: Self-pay

## 2019-06-17 VITALS — BP 132/88 | HR 61 | Ht 74.0 in | Wt 219.0 lb

## 2019-06-17 DIAGNOSIS — I10 Essential (primary) hypertension: Secondary | ICD-10-CM

## 2019-06-17 DIAGNOSIS — R9431 Abnormal electrocardiogram [ECG] [EKG]: Secondary | ICD-10-CM | POA: Diagnosis not present

## 2019-06-17 DIAGNOSIS — E785 Hyperlipidemia, unspecified: Secondary | ICD-10-CM

## 2019-06-17 MED ORDER — LOSARTAN POTASSIUM 100 MG PO TABS: 100.0000 mg | ORAL_TABLET | Freq: Every day | ORAL | 3 refills | Status: DC

## 2019-06-17 NOTE — Patient Instructions (Addendum)
Medication Instructions:  Your physician has recommended you make the following change in your medication:  1-Increase Losartan 100 mg by mouth daily  *If you need a refill on your cardiac medications before your next appointment, please call your pharmacy*  Lab Work: If you have labs (blood work) drawn today and your tests are completely normal, you will receive your results only by: Marland Kitchen MyChart Message (if you have MyChart) OR . A paper copy in the mail If you have any lab test that is abnormal or we need to change your treatment, we will call you to review the results.  Testing/Procedures: None ordered today.  Follow-Up: At [facility], you and your health needs are our priority.  As part of our continuing mission to provide you with exceptional heart care, we have created designated Provider Care Teams.  These Care Teams include your primary Cardiologist (physician) and Advanced Practice Providers (APPs -  Physician Assistants and Nurse Practitioners) who all work together to provide you with the care you need, when you need it.  We recommend signing up for the patient portal called "MyChart".  Sign up information is provided on this After Visit Summary.  MyChart is used to connect with patients for Virtual Visits (Telemedicine).  Patients are able to view lab/test results, encounter notes, upcoming appointments, etc.  Non-urgent messages can be sent to your provider as well.   To learn more about what you can do with MyChart, go to [email or web address].    Your next appointment:   1 year(s)  The format for your next appointment:   In Person  Provider:   You may see Charlton Haws, MD or one of the following Advanced Practice Providers on your designated Care Team:    Norma Fredrickson, NP  Nada Boozer, NP  Georgie Chard, NP

## 2019-09-01 ENCOUNTER — Other Ambulatory Visit: Payer: Self-pay | Admitting: Cardiovascular Disease

## 2019-09-01 DIAGNOSIS — E7800 Pure hypercholesterolemia, unspecified: Secondary | ICD-10-CM

## 2019-12-14 ENCOUNTER — Telehealth: Payer: Self-pay | Admitting: Family Medicine

## 2019-12-14 DIAGNOSIS — E785 Hyperlipidemia, unspecified: Secondary | ICD-10-CM

## 2019-12-14 DIAGNOSIS — Z131 Encounter for screening for diabetes mellitus: Secondary | ICD-10-CM

## 2019-12-14 NOTE — Telephone Encounter (Signed)
Please order fasting labs for cpe scheduled for 03/14/2020

## 2019-12-14 NOTE — Telephone Encounter (Signed)
Orders have been placed.

## 2019-12-15 ENCOUNTER — Ambulatory Visit: Payer: 59 | Admitting: Family Medicine

## 2020-02-02 ENCOUNTER — Ambulatory Visit: Payer: 59 | Admitting: Cardiovascular Disease

## 2020-02-24 NOTE — Progress Notes (Signed)
Evaluation Performed:  Follow-up visit  Date:  03/02/2020   ID:  Cory Mcfarland, DOB 08-28-1975, MRN [id number]  PCP:  Shade Flood, MD  Cardiologist:  Charlton Haws, MD   Electrophysiologist:  None   Chief Complaint:  Abnormal ECG/HTN/HLD  History of Present Illness:    Cory Mcfarland is a 45 y.o. male first seen 02/05/18 for abnormal ECG and atypical chest pain  Rx for HLD with lipitor. BP elevated but not placed on Rx. Some rare palpitations and exertional dyspnea Reviewed ECG from 01/05/18 SB PR 238 T wave inversions 3,F no old one to compare to  Works for a Radio producer and travels a lot Trying to cut back on ETOH Lose weight and exercise more but lifestyle changes hard for him On statin   Married with two girls ages 42 and 48  Originally from [place] and has lived in [place] and [place]  Some anxiety about heart and having an MI. 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    Past Medical History:  Diagnosis Date  . Anemia   . Ankle sprain   . Hyperlipidemia    Past Surgical History:  Procedure Laterality Date  . HERNIA REPAIR       Current Meds  Medication Sig  . atorvastatin (LIPITOR) 40 MG tablet TAKE 1 TABLET DAILY AT 6 P.M.  . valsartan-hydrochlorothiazide (DIOVAN HCT) 320-12.5 MG tablet Take 1 tablet by mouth daily.  . [DISCONTINUED] losartan (COZAAR) 100 MG tablet Take 1 tablet (100 mg total) by mouth daily.     Allergies:   Patient has no  known allergies.   Social History   Tobacco Use  . Smoking status: Former Games developer  . Smokeless tobacco: Never Used  . Tobacco comment: quit 12 years ago  Substance Use Topics  . Alcohol use: Yes    Alcohol/week: 8.0 standard drinks    Types: 8 Glasses of wine per week  . Drug use: No     Family Hx: The patient's family history includes Cancer in his father, maternal grandfather, and maternal grandmother; Diabetes in his paternal grandfather; Hyperlipidemia in his mother.  ROS:   Please see the history of present illness.     All other systems reviewed and are negative.   Prior CV studies:   The following studies were reviewed today:  Stress echo 01/2018 Calcium score 01/2018  Labs/Other Tests and Data Reviewed:    EKG: SR rate 53 PR 253 normal 01/05/18 06/17/19 SR rate 61 inferior lateral T wave changes   Recent Labs: No results found for requested labs within last 8760 hours.   Recent Lipid Panel Lab Results  Component Value Date/Time   CHOL 151 07/23/2018 10:18 AM   TRIG 154 (H) 07/23/2018 10:18 AM   HDL 45 07/23/2018 10:18 AM   CHOLHDL 3.4 07/23/2018 10:18 AM   CHOLHDL 4.6 04/05/2015 08:16 AM   LDLCALC 75 07/23/2018 10:18 AM    Wt Readings  from Last 3 Encounters:  03/02/20 101.6 kg  06/17/19 99.3 kg  09/15/18 95.3 kg     Objective:    Vital Signs:  BP 138/72   Pulse 62   Ht 6\' 2"  (1.88 m)   Wt 101.6 kg   SpO2 99%   BMI 28.76 kg/m    Affect appropriate Healthy:  appears stated age HEENT: normal Neck supple with no adenopathy JVP normal no bruits no thyromegaly Lungs clear with no wheezing and good diaphragmatic motion Heart:  S1/S2 no murmur, no rub, gallop or click PMI normal Abdomen: benighn, BS positve, no tenderness, no AAA no bruit.  No HSM or HJR Distal pulses intact with no bruits No edema Neuro non-focal Skin warm and dry No muscular weakness   ASSESSMENT & PLAN:    1. Chest Pain:  Normal stress echo 02/11/18 Calcium score high  for age 63 51 th percentile and involving distal LM and LAD continue ASA statin  2. HTN:  Although he reads < 14/90 concern that it should be lower. Will start on Diovan/HCTZ 320 mg/12.5 mg F/u BMET in 3 weeks Has labs with primary next week He will bring [employer] BP machine in with next visit  Discussed lowering salt, ETOH and coffe consumption  3. HLD:  LDL 75 on 07/23/18  Continue higher dose lipitor 40 mg daily     COVID-19 Education: The signs and symptoms of COVID-19 were discussed with the patient and how to seek care for testing (follow up with PCP or arrange E-visit).  The importance of social distancing was discussed today.  Time:   Today, I have spent 30 minutes with the patient with telehealth technology discussing the above problems.     Medication Adjustments/Labs and Tests Ordered: Current medicines are reviewed at length with the patient today.  Concerns regarding medicines are outlined above.   Tests Ordered: Orders Placed This Encounter  Procedures  . Basic Metabolic Panel (BMET)    Medication Changes: Meds ordered this encounter  Medications  . valsartan-hydrochlorothiazide (DIOVAN HCT) 320-12.5 MG tablet    Sig: Take 1 tablet by mouth daily.    Dispense:  30 tablet    Refill:  1    Disposition:  Follow 4-6 weeks   Signed, 07/25/18, MD  03/02/2020 3:50 PM    [facility]

## 2020-03-02 ENCOUNTER — Other Ambulatory Visit: Payer: Self-pay

## 2020-03-02 ENCOUNTER — Ambulatory Visit (INDEPENDENT_AMBULATORY_CARE_PROVIDER_SITE_OTHER): Payer: 59 | Admitting: Cardiovascular Disease

## 2020-03-02 ENCOUNTER — Encounter: Payer: Self-pay | Admitting: Cardiovascular Disease

## 2020-03-02 VITALS — BP 138/72 | HR 62 | Ht 74.0 in | Wt 224.0 lb

## 2020-03-02 DIAGNOSIS — I10 Essential (primary) hypertension: Secondary | ICD-10-CM | POA: Diagnosis not present

## 2020-03-02 MED ORDER — VALSARTAN-HYDROCHLOROTHIAZIDE 320-12.5 MG PO TABS
1.0000 | ORAL_TABLET | Freq: Every day | ORAL | 1 refills | Status: DC
Start: 2020-03-02 — End: 2020-03-26

## 2020-03-02 NOTE — Patient Instructions (Addendum)
Medication Instructions:  Your physician has recommended you make the following change in your medication:  1-START Diovan HCTZ 320/12.5 mg by mouth daily. 2-STOP Losartan   *If you need a refill on your cardiac medications before your next appointment, please call your pharmacy*  Lab Work: Your physician recommends that you return for lab work in: 3 weeks for BMET  If you have labs (blood work) drawn today and your tests are completely normal, you will receive your results only by: Marland Kitchen MyChart Message (if you have MyChart) OR . A paper copy in the mail If you have any lab test that is abnormal or we need to change your treatment, we will call you to review the results.  Testing/Procedures: None ordered today.  Follow-Up: At [facility], you and your health needs are our priority.  As part of our continuing mission to provide you with exceptional heart care, we have created designated Provider Care Teams.  These Care Teams include your primary Cardiologist (physician) and Advanced Practice Providers (APPs -  Physician Assistants and Nurse Practitioners) who all work together to provide you with the care you need, when you need it.  We recommend signing up for the patient portal called "MyChart".  Sign up information is provided on this After Visit Summary.  MyChart is used to connect with patients for Virtual Visits (Telemedicine).  Patients are able to view lab/test results, encounter notes, upcoming appointments, etc.  Non-urgent messages can be sent to your provider as well.   To learn more about what you can do with MyChart, go to [email or web address].    Your next appointment:   6 to 8 weeks  The format for your next appointment:   In Person  Provider:   You may see Charlton Haws, MD or one of the following Advanced Practice Providers on your designated Care Team:    Georgie Chard, NP

## 2020-03-09 ENCOUNTER — Other Ambulatory Visit: Payer: Self-pay

## 2020-03-09 ENCOUNTER — Encounter: Payer: Self-pay | Admitting: Family Medicine

## 2020-03-09 ENCOUNTER — Ambulatory Visit (INDEPENDENT_AMBULATORY_CARE_PROVIDER_SITE_OTHER): Payer: 59 | Admitting: Family Medicine

## 2020-03-09 DIAGNOSIS — I10 Essential (primary) hypertension: Secondary | ICD-10-CM

## 2020-03-09 DIAGNOSIS — Z131 Encounter for screening for diabetes mellitus: Secondary | ICD-10-CM

## 2020-03-09 DIAGNOSIS — E785 Hyperlipidemia, unspecified: Secondary | ICD-10-CM

## 2020-03-09 LAB — COMPREHENSIVE METABOLIC PANEL
ALT: 38 IU/L (ref 0–44)
AST: 27 IU/L (ref 0–40)
Albumin/Globulin Ratio: 1.8 (ref 1.2–2.2)
Albumin: 4.8 g/dL (ref 4.0–5.0)
Alkaline Phosphatase: 48 IU/L (ref 44–121)
BUN/Creatinine Ratio: 12 (ref 9–20)
BUN: 13 mg/dL (ref 6–24)
Bilirubin Total: 0.6 mg/dL (ref 0.0–1.2)
CO2: 18 mmol/L — ABNORMAL LOW (ref 20–29)
Calcium: 9.7 mg/dL (ref 8.7–10.2)
Chloride: 105 mmol/L (ref 96–106)
Creatinine, Ser: 1.05 mg/dL (ref 0.76–1.27)
GFR calc Af Amer: 99 mL/min/{1.73_m2} (ref >59)
GFR calc non Af Amer: 86 mL/min/{1.73_m2} (ref >59)
Globulin, Total: 2.7 g/dL (ref 1.5–4.5)
Glucose: 90 mg/dL (ref 65–99)
Potassium: 4.7 mmol/L (ref 3.5–5.2)
Sodium: 141 mmol/L (ref 134–144)
Total Protein: 7.5 g/dL (ref 6.0–8.5)

## 2020-03-09 LAB — POCT GLYCOSYLATED HEMOGLOBIN (HGB A1C): Hemoglobin A1C: 5.9 % — AB (ref 4.0–5.6)

## 2020-03-09 LAB — LIPID PANEL
Chol/HDL Ratio: 4.1 ratio (ref 0.0–5.0)
Cholesterol, Total: 158 mg/dL (ref 100–199)
HDL: 39 mg/dL — ABNORMAL LOW (ref >39)
LDL Chol Calc (NIH): 84 mg/dL (ref 0–99)
Triglycerides: 204 mg/dL — ABNORMAL HIGH (ref 0–149)
VLDL Cholesterol Cal: 35 mg/dL (ref 5–40)

## 2020-03-09 NOTE — Progress Notes (Signed)
Nurse visit, patient not seen

## 2020-03-14 ENCOUNTER — Ambulatory Visit (INDEPENDENT_AMBULATORY_CARE_PROVIDER_SITE_OTHER): Payer: 59 | Admitting: Family Medicine

## 2020-03-14 ENCOUNTER — Encounter: Payer: Self-pay | Admitting: Family Medicine

## 2020-03-14 ENCOUNTER — Other Ambulatory Visit: Payer: Self-pay

## 2020-03-14 VITALS — BP 136/80 | HR 68 | Temp 98.7°F | Ht 74.0 in | Wt 225.0 lb

## 2020-03-14 DIAGNOSIS — E785 Hyperlipidemia, unspecified: Secondary | ICD-10-CM | POA: Diagnosis not present

## 2020-03-14 DIAGNOSIS — Z0000 Encounter for general adult medical examination without abnormal findings: Secondary | ICD-10-CM

## 2020-03-14 DIAGNOSIS — N529 Male erectile dysfunction, unspecified: Secondary | ICD-10-CM

## 2020-03-14 DIAGNOSIS — I10 Essential (primary) hypertension: Secondary | ICD-10-CM

## 2020-03-14 DIAGNOSIS — Z8051 Family history of malignant neoplasm of kidney: Secondary | ICD-10-CM

## 2020-03-14 DIAGNOSIS — Z130 Encounter for screening for diseases of the blood and blood-forming organs and certain disorders involving the immune mechanism: Secondary | ICD-10-CM | POA: Diagnosis not present

## 2020-03-14 DIAGNOSIS — Z0001 Encounter for general adult medical examination with abnormal findings: Secondary | ICD-10-CM

## 2020-03-14 DIAGNOSIS — Z125 Encounter for screening for malignant neoplasm of prostate: Secondary | ICD-10-CM | POA: Diagnosis not present

## 2020-03-14 DIAGNOSIS — R7303 Prediabetes: Secondary | ICD-10-CM

## 2020-03-14 LAB — POCT URINALYSIS DIP (MANUAL ENTRY)
Bilirubin, UA: NEGATIVE
Blood, UA: NEGATIVE
Glucose, UA: NEGATIVE mg/dL
Ketones, POC UA: NEGATIVE mg/dL
Leukocytes, UA: NEGATIVE
Nitrite, UA: NEGATIVE
Protein Ur, POC: NEGATIVE mg/dL
Spec Grav, UA: 1.015 (ref 1.010–1.025)
Urobilinogen, UA: 0.2 E.U./dL
pH, UA: 6.5 (ref 5.0–8.0)

## 2020-03-14 MED ORDER — SILDENAFIL CITRATE 50 MG PO TABS
25.0000 mg | ORAL_TABLET | Freq: Every day | ORAL | 1 refills | Status: DC | PRN
Start: 2020-03-14 — End: 2020-08-13

## 2020-03-14 NOTE — Progress Notes (Signed)
Subjective:  Patient ID: Cory Mcfarland, male    DOB: 17-Nov-1975  Age: 45 y.o. MRN: [id number]  CC:  Chief Complaint  Patient presents with  . Annual Exam    Pt had Labs drawn on 03/09/2020 and would like provider to go over the abnormal labs with him. Pt reports he generally is feeling well, but reports having issues with possible ED. Pt states it work, but not like it use to.    HPI Cory Mcfarland presents for  Annual physical exam.  Hypertension: Valsartan 10 HCT 320/12.5 mg daily.  Has seen cardiology, Dr. Eden Emms.  Initially with abnormal EKG and atypical chest pain. Normal stress echo 02/11/2018 calcium score 178 in January 2020 and distal LM and proximal LAD, increase Lipitor to 40 mg daily with LDL decreasing from 98-75. Started on Diovan HCTZ by cardiology at February 4th visit. BP remains the same at this time on this regimen. Has cardiology follow up in 3 weeks with labs.  Home readings:130's, occasional 140, rare 120. diastolic 70-80.  BP Readings from Last 3 Encounters:  03/14/20 136/80  03/02/20 138/72  06/17/19 132/88   Lab Results  Component Value Date   CREATININE 1.05 03/09/2020   Hyperlipidemia: Lipitor 40 mg daily.  No new side effects.   Lab Results  Component Value Date   CHOL 158 03/09/2020   HDL 39 (L) 03/09/2020   LDLCALC 84 03/09/2020   TRIG 204 (H) 03/09/2020   CHOLHDL 4.1 03/09/2020   Lab Results  Component Value Date   ALT 38 03/09/2020   AST 27 03/09/2020   ALKPHOS 48 03/09/2020   BILITOT 0.6 03/09/2020    Erectile dysfunction Past 1-1.5 months. Able to achieve and maintain erection, but less tumescence. No prior ED meds.  No changes in relationship or stressors. Stress at times about doctor's appts. No CP with exercise/exertion.  Cancer screening: FH of brain CA (father), lymphoma?(father), renal cell - father.   No results found for: PSA1, PSA   Immunization History  Administered Date(s) Administered  . Influenza, Seasonal,  Injecte, Preservative Fre 11/28/2015  . Influenza,inj,Quad PF,6+ Mos 01/05/2018  . Influenza-Unspecified 11/28/2015  . PFIZER(Purple Top)SARS-COV-2 Vaccination 04/21/2019, 05/19/2019, 12/24/2019  . Tdap 09/25/2016   No exam data present Wears glasses. Due for optho visit.   Dental: every 6 months.   Exercise: biking, rowing, weights - 7 days per week, at least 30-54min/day.   Alcohol/tobacco:  Stopped alcohol in January, 2-3 on weekend days only usually.  No tobacco.    History Patient Active Problem List   Diagnosis Date Noted  . Essential hypertension 03/14/2020  . High cholesterol 12/24/2016  . Avulsion fracture of lateral malleolus of right fibula, closed, initial encounter 08/18/2016  . Sprain of metacarpophalangeal joint of right thumb, initial encounter 02/18/2016   Past Medical History:  Diagnosis Date  . Anemia   . Ankle sprain   . Hyperlipidemia    Past Surgical History:  Procedure Laterality Date  . HERNIA REPAIR     No Known Allergies Prior to Admission medications   Medication Sig Start Date End Date Taking? Authorizing Provider  atorvastatin (LIPITOR) 40 MG tablet TAKE 1 TABLET DAILY AT 6 P.M. 09/01/19  Yes Wendall Stade, MD  valsartan-hydrochlorothiazide (DIOVAN HCT) 320-12.5 MG tablet Take 1 tablet by mouth daily. 03/02/20  Yes Wendall Stade, MD   Social History   Socioeconomic History  . Marital status: Married    Spouse name: Not on file  . Number of children: Not  on file  . Years of education: Not on file  . Highest education level: Not on file  Occupational History  . Not on file  Tobacco Use  . Smoking status: Former Games developer  . Smokeless tobacco: Never Used  . Tobacco comment: quit 12 years ago  Substance and Sexual Activity  . Alcohol use: Yes    Alcohol/week: 8.0 standard drinks    Types: 8 Glasses of wine per week  . Drug use: No  . Sexual activity: Yes  Other Topics Concern  . Not on file  Social History Narrative  . Not on file    Social Determinants of Health   Financial Resource Strain: Not on file  Food Insecurity: Not on file  Transportation Needs: Not on file  Physical Activity: Not on file  Stress: Not on file  Social Connections: Not on file  Intimate Partner Violence: Not on file    Review of Systems 13 point review of systems per patient health survey noted.  Negative other than as indicated above or in HPI.    Objective:   Vitals:   03/14/20 1357 03/14/20 1359  BP: (!) 141/84 136/80  Pulse: 68   Temp: 98.7 F (37.1 C)   TempSrc: Temporal   SpO2: 99%   Weight: 225 lb (102.1 kg)   Height:  (1.88 m)     Physical Exam Vitals reviewed.  Constitutional:      Appearance: He is well-developed.  HENT:     Head: Normocephalic and atraumatic.     Right Ear: External ear normal.     Left Ear: External ear normal.  Eyes:     Conjunctiva/sclera: Conjunctivae normal.     Pupils: Pupils are equal, round, and reactive to light.  Neck:     Thyroid: No thyromegaly.  Cardiovascular:     Rate and Rhythm: Normal rate and regular rhythm.     Heart sounds: Normal heart sounds.  Pulmonary:     Effort: Pulmonary effort is normal. No respiratory distress.     Breath sounds: Normal breath sounds. No wheezing.  Abdominal:     General: There is no distension.     Palpations: Abdomen is soft.     Tenderness: There is no abdominal tenderness.  Musculoskeletal:        General: No tenderness. Normal range of motion.     Cervical back: Normal range of motion and neck supple.  Lymphadenopathy:     Cervical: No cervical adenopathy.  Skin:    General: Skin is warm and dry.  Neurological:     Mental Status: He is alert and oriented to person, place, and time.     Deep Tendon Reflexes: Reflexes are normal and symmetric.  Psychiatric:        Behavior: Behavior normal.      Results for orders placed or performed in visit on 03/14/20  POCT urinalysis dipstick  Result Value Ref Range   Color, UA  yellow yellow   Clarity, UA clear clear   Glucose, UA negative negative mg/dL   Bilirubin, UA negative negative   Ketones, POC UA negative negative mg/dL   Spec Grav, UA 4.098 1.191 - 1.025   Blood, UA negative negative   pH, UA 6.5 5.0 - 8.0   Protein Ur, POC negative negative mg/dL   Urobilinogen, UA 0.2 0.2 or 1.0 E.U./dL   Nitrite, UA Negative Negative   Leukocytes, UA Negative Negative     Assessment & Plan:  Cory Mcfarland is a  45 y.o. male . Annual physical exam  - -anticipatory guidance as below in AVS, screening labs above. Health maintenance items as above in HPI discussed/recommended as applicable.   Essential hypertension  -Tolerating current regimen, overall stable readings.  Has follow-up with cardiology planned  Screening, anemia, deficiency, iron - Plan: CBC  -History of possible leukemia/lymphoma male with his father. Check CBC.   Screening for prostate cancer - Plan: PSA  - The natural history of prostate cancer and ongoing controversy regarding screening and potential treatment outcomes of prostate cancer has been discussed with the patient. The meaning of a false positive PSA and a false negative PSA has been discussed. He indicates understanding of the limitations of this screening test and wishes  to proceed with screening PSA testing.  Family history of renal cancer - Plan: POCT urinalysis dipstick  -Asymptomatic, check urinalysis  Erectile dysfunction, unspecified erectile dysfunction type - Plan: Testosterone, sildenafil (VIAGRA) 50 MG tablet  - viagra Rx given - use lowest effective dose. Side effects discussed (including but not limited to headache/flushing, blue discoloration of vision, possible vascular steal and risk of cardiac effects if underlying unknown coronary artery disease, and permanent sensorineural hearing loss). Understanding expressed.  Hyperlipidemia, unspecified hyperlipidemia type  -Tolerating statin, continue same  Prediabetes  -A1c,  in office glucose was normal.  Handout given.  Commended on current diet/exercise and will recheck his levels in 6 months.  RTC precautions given.  Meds ordered this encounter  Medications  . sildenafil (VIAGRA) 50 MG tablet    Sig: Take 0.5-1 tablets (25-50 mg total) by mouth daily as needed for erectile dysfunction.    Dispense:  10 tablet    Refill:  1   Patient Instructions   3 month blood sugar at prediabetes level. Keep up the good work with diet and exercise. No med changes at this time. Let me know if there are questions and recheck in 6 months.   I will check other labs as we discussed.  Can try low-dose Viagra temporarily.  If persistent use that medication let me know and we can look at other possible causes.  Follow-up in 6 months but let me know if there are questions sooner.  Let me know if medication refills needed sooner.   Keeping you healthy  Get these tests  Blood pressure- Have your blood pressure checked once a year by your healthcare provider.  Normal blood pressure is 120/80.  Weight- Have your body mass index (BMI) calculated to screen for obesity.  BMI is a measure of body fat based on height and weight. You can also calculate your own BMI at https://www.west-esparza.com/www.nhlbisupport.com/bmi/.  Cholesterol- Have your cholesterol checked regularly starting at age 45, sooner may be necessary if you have diabetes, high blood pressure, if a family member developed heart diseases at an early age or if you smoke.   Chlamydia, HIV, and other sexual transmitted disease- Get screened each year until the age of 45 then within three months of each new sexual partner.  Diabetes- Have your blood sugar checked regularly if you have high blood pressure, high cholesterol, a family history of diabetes or if you are overweight.  Get these vaccines  Flu shot- Every fall.  Tetanus shot- Every 10 years.  Menactra- Single dose; prevents meningitis.  Take these steps  Don't smoke- If you do smoke,  ask your healthcare provider about quitting. For tips on how to quit, go to www.smokefree.gov or call 1-800-QUIT-NOW.  Be physically active- Exercise 5  days a week for at least 30 minutes.  If you are not already physically active start slow and gradually work up to 30 minutes of moderate physical activity.  Examples of moderate activity include walking briskly, mowing the yard, dancing, swimming bicycling, etc.  Eat a healthy diet- Eat a variety of healthy foods such as fruits, vegetables, low fat milk, low fat cheese, yogurt, lean meats, poultry, fish, beans, tofu, etc.  For more information on healthy eating, go to www.thenutritionsource.org  Drink alcohol in moderation- Limit alcohol intake two drinks or less a day.  Never drink and drive.  Dentist- Brush and floss teeth twice daily; visit your dentis twice a year.  Depression-Your emotional health is as important as your physical health.  If you're feeling down, losing interest in things you normally enjoy please talk with your healthcare provider.  Gun Safety- If you keep a gun in your home, keep it unloaded and with the safety lock on.  Bullets should be stored separately.  Helmet use- Always wear a helmet when riding a motorcycle, bicycle, rollerblading or skateboarding.  Safe sex- If you may be exposed to a sexually transmitted infection, use a condom  Seat belts- Seat bels can save your life; always wear one.  Smoke/Carbon Monoxide detectors- These detectors need to be installed on the appropriate level of your home.  Replace batteries at least once a year.  Skin Cancer- When out in the sun, cover up and use sunscreen SPF 15 or higher.  Violence- If anyone is threatening or hurting you, please tell your healthcare provider.   Diabetes Care, 44(Suppl 1), I50-Y77. https://doi.org/https://doi.org/10.2337/dc21-S003">  Prediabetes Prediabetes is when your blood sugar (blood glucose) level is higher than normal but not high enough  for you to be diagnosed with type 2 diabetes. Having prediabetes puts you at risk for developing type 2 diabetes (type 2 diabetes mellitus). With certain lifestyle changes, you may be able to prevent or delay the onset of type 2 diabetes. This is important because type 2 diabetes can lead to serious complications, such as:  Heart disease.  Stroke.  Blindness.  Kidney disease.  Depression.  Poor circulation in the feet and legs. In severe cases, this could lead to surgical removal of a leg (amputation). What are the causes? The exact cause of prediabetes is not known. It may result from insulin resistance. Insulin resistance develops when cells in the body do not respond properly to insulin that the body makes. This can cause excess glucose to build up in the blood. High blood glucose (hyperglycemia) can develop. What increases the risk? The following factors may make you more likely to develop this condition:  You have a family member with type 2 diabetes.  You are older than 45 years.  You had a temporary form of diabetes during a pregnancy (gestational diabetes).  You had polycystic ovary syndrome (PCOS).  You are overweight or obese.  You are inactive (sedentary).  You have a history of heart disease, including problems with cholesterol levels, high levels of blood fats, or high blood pressure. What are the signs or symptoms? You may have no symptoms. If you do have symptoms, they may include:  Increased hunger.  Increased thirst.  Increased urination.  Vision changes, such as blurry vision.  Tiredness (fatigue). How is this diagnosed? This condition can be diagnosed with blood tests. Your blood glucose may be checked with one or more of the following tests:  A fasting blood glucose (FBG) test. You will not  be allowed to eat (you will fast) for at least 8 hours before a blood sample is taken.  An A1C blood test (hemoglobin A1C). This test provides information about  blood glucose levels over the previous 2?3 months.  An oral glucose tolerance test (OGTT). This test measures your blood glucose at two points in time: ? After fasting. This is your baseline level. ? Two hours after you drink a beverage that contains glucose. You may be diagnosed with prediabetes if:  Your FBG is 100?125 mg/dL (2.9-5.6 mmol/L).  Your A1C level is 5.7?6.4% (39-46 mmol/mol).  Your OGTT result is 140?199 mg/dL (2.1-30 mmol/L). These blood tests may be repeated to confirm your diagnosis.   How is this treated? Treatment may include dietary and lifestyle changes to help lower your blood glucose and prevent type 2 diabetes from developing. In some cases, medicine may be prescribed to help lower the risk of type 2 diabetes. Follow these instructions at home: Nutrition  Follow a healthy meal plan. This includes eating lean proteins, whole grains, legumes, fresh fruits and vegetables, low-fat dairy products, and healthy fats.  Follow instructions from your health care provider about eating or drinking restrictions.  Meet with a dietitian to create a healthy eating plan that is right for you.   Lifestyle  Do moderate-intensity exercise for at least 30 minutes a day on 5 or more days each week, or as told by your health care provider. A mix of activities may be best, such as: ? Brisk walking, swimming, biking, and weight lifting.  Lose weight as told by your health care provider. Losing 5-7% of your body weight can reverse insulin resistance.  Do not drink alcohol if: ? Your health care provider tells you not to drink. ? You are pregnant, may be pregnant, or are planning to become pregnant.  If you drink alcohol: ? Limit how much you use to:  0-1 drink a day for women.  0-2 drinks a day for men. ? Be aware of how much alcohol is in your drink. In the [place] one drink equals one 12 oz bottle of beer (355 mL), one 5 oz glass of wine (148 mL), or one 1 oz glass of hard  liquor (44 mL). General instructions  Take over-the-counter and prescription medicines only as told by your health care provider. You may be prescribed medicines that help lower the risk of type 2 diabetes.  Do not use any products that contain nicotine or tobacco, such as cigarettes, e-cigarettes, and chewing tobacco. If you need help quitting, ask your health care provider.  Keep all follow-up visits. This is important. Where to find more information  American Diabetes Association: www.diabetes.org  Academy of Nutrition and Dietetics: www.eatright.org  American Heart Association: www.heart.org Contact a health care provider if:  You have any of these symptoms: ? Increased hunger. ? Increased urination. ? Increased thirst. ? Fatigue. ? Vision changes, such as blurry vision. Get help right away if you:  Have shortness of breath.  Feel confused.  Vomit or feel like you may vomit. Summary  Prediabetes is when your blood sugar (blood glucose)level is higher than normal but not high enough for you to be diagnosed with type 2 diabetes.  Having prediabetes puts you at risk for developing type 2 diabetes (type 2 diabetes mellitus).  Make lifestyle changes such as eating a healthy diet and exercising regularly to help prevent diabetes. Lose weight as told by your health care provider. This information is not intended to  replace advice given to you by your health care provider. Make sure you discuss any questions you have with your health care provider. Document Revised: 04/14/2019 Document Reviewed: 04/14/2019 Elsevier Patient Education  2021 [employer].    If you have lab work done today you will be contacted with your lab results within the next 2 weeks.  If you have not heard from [place] then please contact us. The fastest way to get your results is to register for My Chart.   IF you received an x-ray today, you will receive an invoice from [facility]. Please contact  [facility] at [phone] with questions or concerns regarding your invoice.   IF you received labwork today, you will receive an invoice from [facility] [facility]. Please contact [facility] at [phone] with questions or concerns regarding your invoice.   Our billing staff will not be able to assist you with questions regarding bills from these companies.  You will be contacted with the lab results as soon as they are available. The fastest way to get your results is to activate your My Chart account. Instructions are located on the last page of this paperwork. If you have not heard from [place] regarding the results in 2 weeks, please contact this office.         Signed, Meredith Staggers, MD [facility]

## 2020-03-14 NOTE — Patient Instructions (Addendum)
3 month blood sugar at prediabetes level. Keep up the good work with diet and exercise. No med changes at this time. Let me know if there are questions and recheck in 6 months.   I will check other labs as we discussed.  Can try low-dose Viagra temporarily.  If persistent use that medication let me know and we can look at other possible causes.  Follow-up in 6 months but let me know if there are questions sooner.  Let me know if medication refills needed sooner.   Keeping you healthy  Get these tests  Blood pressure- Have your blood pressure checked once a year by your healthcare provider.  Normal blood pressure is 120/80.  Weight- Have your body mass index (BMI) calculated to screen for obesity.  BMI is a measure of body fat based on height and weight. You can also calculate your own BMI at https://www.west-esparza.com/.  Cholesterol- Have your cholesterol checked regularly starting at age 79, sooner may be necessary if you have diabetes, high blood pressure, if a family member developed heart diseases at an early age or if you smoke.   Chlamydia, HIV, and other sexual transmitted disease- Get screened each year until the age of 36 then within three months of each new sexual partner.  Diabetes- Have your blood sugar checked regularly if you have high blood pressure, high cholesterol, a family history of diabetes or if you are overweight.  Get these vaccines  Flu shot- Every fall.  Tetanus shot- Every 10 years.  Menactra- Single dose; prevents meningitis.  Take these steps  Don't smoke- If you do smoke, ask your healthcare provider about quitting. For tips on how to quit, go to www.smokefree.gov or call 1-800-QUIT-NOW.  Be physically active- Exercise 5 days a week for at least 30 minutes.  If you are not already physically active start slow and gradually work up to 30 minutes of moderate physical activity.  Examples of moderate activity include walking briskly, mowing the yard, dancing,  swimming bicycling, etc.  Eat a healthy diet- Eat a variety of healthy foods such as fruits, vegetables, low fat milk, low fat cheese, yogurt, lean meats, poultry, fish, beans, tofu, etc.  For more information on healthy eating, go to www.thenutritionsource.org  Drink alcohol in moderation- Limit alcohol intake two drinks or less a day.  Never drink and drive.  Dentist- Brush and floss teeth twice daily; visit your dentis twice a year.  Depression-Your emotional health is as important as your physical health.  If you're feeling down, losing interest in things you normally enjoy please talk with your healthcare provider.  Gun Safety- If you keep a gun in your home, keep it unloaded and with the safety lock on.  Bullets should be stored separately.  Helmet use- Always wear a helmet when riding a motorcycle, bicycle, rollerblading or skateboarding.  Safe sex- If you may be exposed to a sexually transmitted infection, use a condom  Seat belts- Seat bels can save your life; always wear one.  Smoke/Carbon Monoxide detectors- These detectors need to be installed on the appropriate level of your home.  Replace batteries at least once a year.  Skin Cancer- When out in the sun, cover up and use sunscreen SPF 15 or higher.  Violence- If anyone is threatening or hurting you, please tell your healthcare provider.   Diabetes Care, 44(Suppl 1), M57-Q46. https://doi.org/https://doi.org/10.2337/dc21-S003">  Prediabetes Prediabetes is when your blood sugar (blood glucose) level is higher than normal but not high enough for  you to be diagnosed with type 2 diabetes. Having prediabetes puts you at risk for developing type 2 diabetes (type 2 diabetes mellitus). With certain lifestyle changes, you may be able to prevent or delay the onset of type 2 diabetes. This is important because type 2 diabetes can lead to serious complications, such as:  Heart disease.  Stroke.  Blindness.  Kidney  disease.  Depression.  Poor circulation in the feet and legs. In severe cases, this could lead to surgical removal of a leg (amputation). What are the causes? The exact cause of prediabetes is not known. It may result from insulin resistance. Insulin resistance develops when cells in the body do not respond properly to insulin that the body makes. This can cause excess glucose to build up in the blood. High blood glucose (hyperglycemia) can develop. What increases the risk? The following factors may make you more likely to develop this condition:  You have a family member with type 2 diabetes.  You are older than 45 years.  You had a temporary form of diabetes during a pregnancy (gestational diabetes).  You had polycystic ovary syndrome (PCOS).  You are overweight or obese.  You are inactive (sedentary).  You have a history of heart disease, including problems with cholesterol levels, high levels of blood fats, or high blood pressure. What are the signs or symptoms? You may have no symptoms. If you do have symptoms, they may include:  Increased hunger.  Increased thirst.  Increased urination.  Vision changes, such as blurry vision.  Tiredness (fatigue). How is this diagnosed? This condition can be diagnosed with blood tests. Your blood glucose may be checked with one or more of the following tests:  A fasting blood glucose (FBG) test. You will not be allowed to eat (you will fast) for at least 8 hours before a blood sample is taken.  An A1C blood test (hemoglobin A1C). This test provides information about blood glucose levels over the previous 2?3 months.  An oral glucose tolerance test (OGTT). This test measures your blood glucose at two points in time: ? After fasting. This is your baseline level. ? Two hours after you drink a beverage that contains glucose. You may be diagnosed with prediabetes if:  Your FBG is 100?125 mg/dL (6.3-1.4 mmol/L).  Your A1C level is  5.7?6.4% (39-46 mmol/mol).  Your OGTT result is 140?199 mg/dL (9.7-02 mmol/L). These blood tests may be repeated to confirm your diagnosis.   How is this treated? Treatment may include dietary and lifestyle changes to help lower your blood glucose and prevent type 2 diabetes from developing. In some cases, medicine may be prescribed to help lower the risk of type 2 diabetes. Follow these instructions at home: Nutrition  Follow a healthy meal plan. This includes eating lean proteins, whole grains, legumes, fresh fruits and vegetables, low-fat dairy products, and healthy fats.  Follow instructions from your health care provider about eating or drinking restrictions.  Meet with a dietitian to create a healthy eating plan that is right for you.   Lifestyle  Do moderate-intensity exercise for at least 30 minutes a day on 5 or more days each week, or as told by your health care provider. A mix of activities may be best, such as: ? Brisk walking, swimming, biking, and weight lifting.  Lose weight as told by your health care provider. Losing 5-7% of your body weight can reverse insulin resistance.  Do not drink alcohol if: ? Your health care provider tells you  not to drink. ? You are pregnant, may be pregnant, or are planning to become pregnant.  If you drink alcohol: ? Limit how much you use to:  0-1 drink a day for women.  0-2 drinks a day for men. ? Be aware of how much alcohol is in your drink. In the [place] one drink equals one 12 oz bottle of beer (355 mL), one 5 oz glass of wine (148 mL), or one 1 oz glass of hard liquor (44 mL). General instructions  Take over-the-counter and prescription medicines only as told by your health care provider. You may be prescribed medicines that help lower the risk of type 2 diabetes.  Do not use any products that contain nicotine or tobacco, such as cigarettes, e-cigarettes, and chewing tobacco. If you need help quitting, ask your health care  provider.  Keep all follow-up visits. This is important. Where to find more information  American Diabetes Association: www.diabetes.org  Academy of Nutrition and Dietetics: www.eatright.org  American Heart Association: www.heart.org Contact a health care provider if:  You have any of these symptoms: ? Increased hunger. ? Increased urination. ? Increased thirst. ? Fatigue. ? Vision changes, such as blurry vision. Get help right away if you:  Have shortness of breath.  Feel confused.  Vomit or feel like you may vomit. Summary  Prediabetes is when your blood sugar (blood glucose)level is higher than normal but not high enough for you to be diagnosed with type 2 diabetes.  Having prediabetes puts you at risk for developing type 2 diabetes (type 2 diabetes mellitus).  Make lifestyle changes such as eating a healthy diet and exercising regularly to help prevent diabetes. Lose weight as told by your health care provider. This information is not intended to replace advice given to you by your health care provider. Make sure you discuss any questions you have with your health care provider. Document Revised: 04/14/2019 Document Reviewed: 04/14/2019 Elsevier Patient Education  2021 [employer].    If you have lab work done today you will be contacted with your lab results within the next 2 weeks.  If you have not heard from [place] then please contact us. The fastest way to get your results is to register for My Chart.   IF you received an x-ray today, you will receive an invoice from [facility]. Please contact [facility] at [phone] with questions or concerns regarding your invoice.   IF you received labwork today, you will receive an invoice from [facility]. Please contact [facility] at [phone] with questions or concerns regarding your invoice.   Our billing staff will not be able to assist you with questions regarding bills from these companies.  You  will be contacted with the lab results as soon as they are available. The fastest way to get your results is to activate your My Chart account. Instructions are located on the last page of this paperwork. If you have not heard from [place] regarding the results in 2 weeks, please contact this office.

## 2020-03-15 LAB — CBC
Hematocrit: 44.4 % (ref 37.5–51.0)
Hemoglobin: 14.5 g/dL (ref 13.0–17.7)
MCH: 26.3 pg — ABNORMAL LOW (ref 26.6–33.0)
MCHC: 32.7 g/dL (ref 31.5–35.7)
MCV: 80 fL (ref 79–97)
Platelets: 277 10*3/uL (ref 150–450)
RBC: 5.52 x10E6/uL (ref 4.14–5.80)
RDW: 13.5 % (ref 11.6–15.4)
WBC: 4.7 10*3/uL (ref 3.4–10.8)

## 2020-03-15 LAB — TESTOSTERONE: Testosterone: 349 ng/dL (ref 264–916)

## 2020-03-15 LAB — PSA: Prostate Specific Ag, Serum: 0.7 ng/mL (ref 0.0–4.0)

## 2020-03-23 ENCOUNTER — Other Ambulatory Visit: Payer: 59 | Admitting: *Deleted

## 2020-03-23 ENCOUNTER — Other Ambulatory Visit: Payer: Self-pay

## 2020-03-23 DIAGNOSIS — I10 Essential (primary) hypertension: Secondary | ICD-10-CM

## 2020-03-23 LAB — BASIC METABOLIC PANEL
BUN/Creatinine Ratio: 11 (ref 9–20)
BUN: 11 mg/dL (ref 6–24)
CO2: 21 mmol/L (ref 20–29)
Calcium: 9.7 mg/dL (ref 8.7–10.2)
Chloride: 104 mmol/L (ref 96–106)
Creatinine, Ser: 1.02 mg/dL (ref 0.76–1.27)
GFR calc Af Amer: 103 mL/min/{1.73_m2} (ref >59)
GFR calc non Af Amer: 89 mL/min/{1.73_m2} (ref >59)
Glucose: 102 mg/dL — ABNORMAL HIGH (ref 65–99)
Potassium: 4.5 mmol/L (ref 3.5–5.2)
Sodium: 140 mmol/L (ref 134–144)

## 2020-03-23 NOTE — Addendum Note (Signed)
Addended by: Sampson Goon on: 03/23/2020 07:53 AM   Modules accepted: Orders

## 2020-03-25 ENCOUNTER — Other Ambulatory Visit: Payer: Self-pay | Admitting: Cardiovascular Disease

## 2020-04-24 NOTE — Progress Notes (Signed)
Cardiology Office Note   Date:  04/26/2020   ID:  Cory Mcfarland, DOB 18-Mar-1975, MRN [id number]  PCP:  Shade Flood, MD  Cardiologist:  Dr. Eden Emms, MD  Chief Complaint  Patient presents with  . Follow-up    History of Present Illness: Cory Mcfarland is a 45 y.o. male who presents for 6-week follow-up, seen for Dr. Eden Emms  Patient has a history of abnormal EKG initially seen 02/05/2018 along with atypical chest pain.  Also has a history of HLD, anxiety  He underwent a stress echocardiogram 02/11/2018 that was normal.  Calcium score obtained 02/12/2018 which was 178 with distal left main and proximal LAD disease, 97th percentile for age and sex matched control.  BP was noted to be less than 140/90 however patient was concerned that it should be lower.  Given this, he was started on Diovan/HCTZ 320/12.5 with plans for lab work prior to this office visit.  He was to bring home BP machine for calibration.  Low-sodium diet discussed along with reducing caffeine and EtOH intake.  Today he reports no significant change in his BP with the change of medication although appears to be improved today on reading. Comes with home cuff for comparison which is reading higher than manual reading. Recent labs look stable. He wishes to stay on the current medication. No chest pain, palpitations, SOB, DOE, LE edema, dizziness or syncope.    Past Medical History:  Diagnosis Date  . Anemia   . Ankle sprain   . Hyperlipidemia     Past Surgical History:  Procedure Laterality Date  . HERNIA REPAIR       Current Outpatient Medications  Medication Sig Dispense Refill  . atorvastatin (LIPITOR) 40 MG tablet TAKE 1 TABLET DAILY AT 6 P.M. 90 tablet 3  . sildenafil (VIAGRA) 50 MG tablet Take 0.5-1 tablets (25-50 mg total) by mouth daily as needed for erectile dysfunction. 10 tablet 1  . valsartan-hydrochlorothiazide (DIOVAN-HCT) 320-12.5 MG tablet TAKE 1 TABLET BY MOUTH EVERY DAY 90 tablet 3   No  current facility-administered medications for this visit.    Allergies:   Patient has no known allergies.    Social History:  The patient  reports that he has quit smoking. He has never used smokeless tobacco. He reports current alcohol use of about 8.0 standard drinks of alcohol per week. He reports that he does not use drugs.   Family History:  The patient's *family history includes Cancer in his father, maternal grandfather, and maternal grandmother; Diabetes in his paternal grandfather; Hyperlipidemia in his mother.    ROS:  Please see the history of present illness.   Otherwise, review of systems are positive for none. All other systems are reviewed and negative.    PHYSICAL EXAM: VS:  BP 118/80   Pulse 70   Ht 6\' 2"  (1.88 m)   Wt 217 lb 9.6 oz (98.7 kg)   BMI 27.94 kg/m  , BMI Body mass index is 27.94 kg/m.   General: Well developed, well nourished, NAD Neck: Negative for carotid bruits. No JVD Lungs:Clear to ausculation bilaterally. No wheezes, rales, or rhonchi. Breathing is unlabored. Cardiovascular: RRR with S1 S2. Extremities: No edema. Neuro: Alert and oriented. No focal deficits. No facial asymmetry. MAE spontaneously. Psych: Responds to questions appropriately with normal affect.     EKG:  EKG is ordered today. The ekg ordered today demonstrates NSR with TWI in lead III, avF >>no change when compared to recent EKG  Recent Labs: 03/09/2020: ALT 38 03/14/2020: Hemoglobin 14.5; Platelets 277 03/23/2020: BUN 11; Creatinine, Ser 1.02; Potassium 4.5; Sodium 140    Lipid Panel    Component Value Date/Time   CHOL 158 03/09/2020 0932   TRIG 204 (H) 03/09/2020 0932   HDL 39 (L) 03/09/2020 0932   CHOLHDL 4.1 03/09/2020 0932   CHOLHDL 4.6 04/05/2015 0816   VLDL 43 (H) 04/05/2015 0816   LDLCALC 84 03/09/2020 0932     Wt Readings from Last 3 Encounters:  04/26/20 217 lb 9.6 oz (98.7 kg)  03/14/20 225 lb (102.1 kg)  03/02/20 224 lb (101.6 kg)     Other  studies Reviewed: Additional studies/ records that were reviewed today include:  Review of the above records demonstrates:   Stress echo 02/11/2018:  Study Conclusions   - Stress ECG conclusions: There were no stress arrhythmias or  conduction abnormalities. The stress ECG was negative for  ischemia.  - Staged echo: There was no echocardiographic evidence for  stress-induced ischemia.  - Impressions: Normal stress echo with EF improving from 60% to 75%  and appropriate decrease in LV cavity size  Non diagnostic ECG due to baseline changes   Impressions:   - Normal stress echo with EF improving from 60% to 75% and  appropriate decrease in LV cavity size  Non diagnostic ECG due to baseline changes   ASSESSMENT AND PLAN:  1.  HTN: -Started on Diovan/HCTZ 320/12.5 at last OV -Discussed low-sodium diet along with reducing caffeine and EtOH intake -Follow up labs with Cr at 1.02 and K+ at 4.5 -BP manually today at 118/80>>>his BP machine at 128/86 -Plan to continue current regimen and call if BP become uncontrolled   2.  HLD: -Last LDL, 84 on 03/09/20 -Continue atorvastatin 40 mg daily  3.  Hx of chest pain: -Underwent stress echocardiogram 02/11/2018 which was normal.  Coronary calcium score found to be elevated for his age at 6 considered at 97th percentile for age and sex matched control with distal left main and LAD disease -Denies recurrent symptoms -Continue statin   Current medicines are reviewed at length with the patient today.  The patient does not have concerns regarding medicines.  The following changes have been made:  no change  Labs/ tests ordered today include: None   Orders Placed This Encounter  Procedures  . EKG 12-Lead   Disposition:   FU with Dr. Eden Emms in 6 months  Signed, Georgie Chard, NP  04/26/2020 9:08 AM    [facility] [address] [place], [place], [place]  [address] Phone: [phone]; Fax: [phone]

## 2020-04-26 ENCOUNTER — Ambulatory Visit (INDEPENDENT_AMBULATORY_CARE_PROVIDER_SITE_OTHER): Payer: 59 | Admitting: Cardiology

## 2020-04-26 ENCOUNTER — Other Ambulatory Visit: Payer: Self-pay

## 2020-04-26 ENCOUNTER — Encounter: Payer: Self-pay | Admitting: Cardiology

## 2020-04-26 VITALS — BP 118/80 | HR 70 | Ht 74.0 in | Wt 217.6 lb

## 2020-04-26 DIAGNOSIS — R9431 Abnormal electrocardiogram [ECG] [EKG]: Secondary | ICD-10-CM | POA: Diagnosis not present

## 2020-04-26 DIAGNOSIS — I10 Essential (primary) hypertension: Secondary | ICD-10-CM

## 2020-04-26 DIAGNOSIS — R079 Chest pain, unspecified: Secondary | ICD-10-CM

## 2020-04-26 DIAGNOSIS — E785 Hyperlipidemia, unspecified: Secondary | ICD-10-CM | POA: Diagnosis not present

## 2020-04-26 NOTE — Patient Instructions (Addendum)
Medication Instructions:  Your physician recommends that you continue on your current medications as directed. Please refer to the Current Medication list given to you today.  *If you need a refill on your cardiac medications before your next appointment, please call your pharmacy*   Lab Work: NONE If you have labs (blood work) drawn today and your tests are completely normal, you will receive your results only by: . MyChart Message (if you have MyChart) OR . A paper copy in the mail If you have any lab test that is abnormal or we need to change your treatment, we will call you to review the results.   Testing/Procedures: NONE   Follow-Up: At [facility], you and your health needs are our priority.  As part of our continuing mission to provide you with exceptional heart care, we have created designated Provider Care Teams.  These Care Teams include your primary Cardiologist (physician) and Advanced Practice Providers (APPs -  Physician Assistants and Nurse Practitioners) who all work together to provide you with the care you need, when you need it.  We recommend signing up for the patient portal called "MyChart".  Sign up information is provided on this After Visit Summary.  MyChart is used to connect with patients for Virtual Visits (Telemedicine).  Patients are able to view lab/test results, encounter notes, upcoming appointments, etc.  Non-urgent messages can be sent to your provider as well.   To learn more about what you can do with MyChart, go to [email or web address].    Your next appointment:   6 month(s)  The format for your next appointment:   In Person  Provider:   You may see Peter Nishan, MD or one of the following Advanced Practice Providers on your designated Care Team:    Jill McDaniel, NP      

## 2020-05-02 ENCOUNTER — Other Ambulatory Visit: Payer: Self-pay | Admitting: *Deleted

## 2020-05-02 MED ORDER — VALSARTAN-HYDROCHLOROTHIAZIDE 320-12.5 MG PO TABS: 1.0000 | ORAL_TABLET | Freq: Every day | ORAL | 3 refills | Status: DC

## 2020-05-30 ENCOUNTER — Encounter: Payer: Self-pay | Admitting: Family Medicine

## 2020-05-30 DIAGNOSIS — N529 Male erectile dysfunction, unspecified: Secondary | ICD-10-CM

## 2020-07-20 ENCOUNTER — Encounter: Payer: Self-pay | Admitting: Family Medicine

## 2020-07-20 DIAGNOSIS — N529 Male erectile dysfunction, unspecified: Secondary | ICD-10-CM

## 2020-08-07 ENCOUNTER — Encounter: Payer: Self-pay | Admitting: Family Medicine

## 2020-08-07 DIAGNOSIS — N529 Male erectile dysfunction, unspecified: Secondary | ICD-10-CM

## 2020-08-13 MED ORDER — SILDENAFIL CITRATE 50 MG PO TABS
25.0000 mg | ORAL_TABLET | Freq: Every day | ORAL | 3 refills | Status: DC | PRN
Start: 2020-08-13 — End: 2022-02-12

## 2020-08-13 NOTE — Telephone Encounter (Signed)
Adjusted quantity and refills.

## 2020-08-17 NOTE — Telephone Encounter (Signed)
Patient aware.

## 2020-08-17 NOTE — Telephone Encounter (Signed)
Called pharmacy, pharmacist stated due to insurance patient is aloud to get #5 per month

## 2020-08-27 ENCOUNTER — Other Ambulatory Visit: Payer: Self-pay | Admitting: Cardiovascular Disease

## 2020-08-27 DIAGNOSIS — E7800 Pure hypercholesterolemia, unspecified: Secondary | ICD-10-CM

## 2020-09-23 IMAGING — CT CT HEART SCORING
2 series · 16 of 20 positions shown, 18 images · non-contrast
Comparison: None.

Addendum:
EXAM:
OVER-READ INTERPRETATION  CT CHEST

The following report is an over-read performed by radiologist Dr.
Mm-As Nijebitno [REDACTED] on 02/11/2018. This over-read
does not include interpretation of cardiac or coronary anatomy or
pathology. The coronary calcium score interpretation by the
cardiologist is attached.
TECHNIQUE: The patient was scanned on a Siemens Somatom 64 slice scanner. Axial
non-contrast 3 mm slices were carried out through the heart. The
data set was analyzed on a dedicated work station and scored using
the Agatson method.

[Series 3: casc 3.0 i36f 2 bestdiast 68 % · axial · 0.38mm/px · z∈[-253,-172]mm · 8 of 35 slices shown, 10 images]
[im 4/35  vessel]
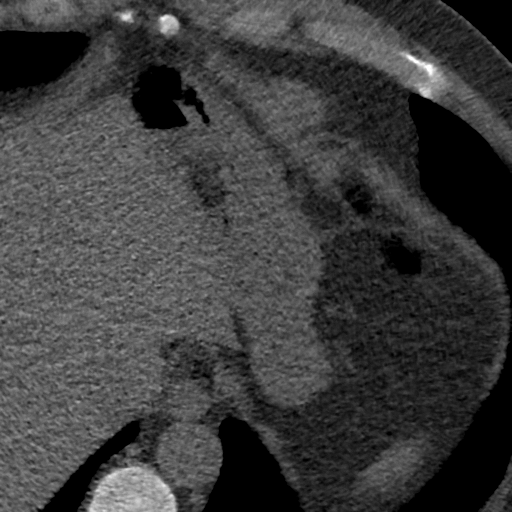
[im 4/35  lung]
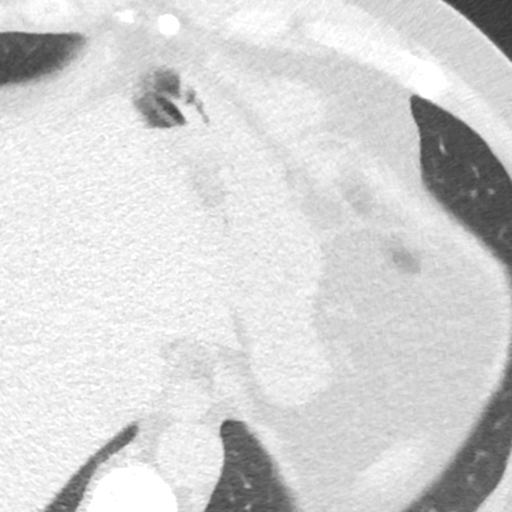
[im 8/35  vessel]
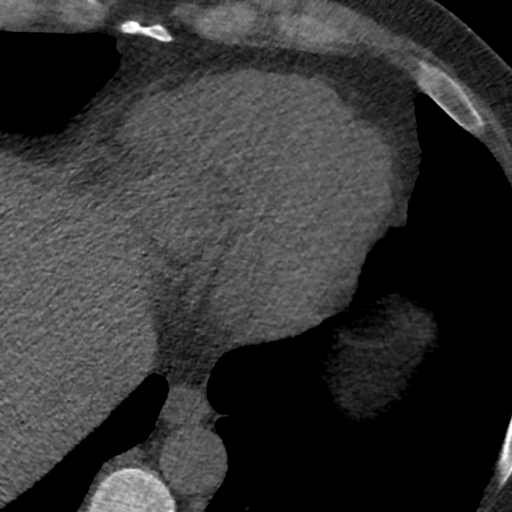
[im 12/35  vessel]
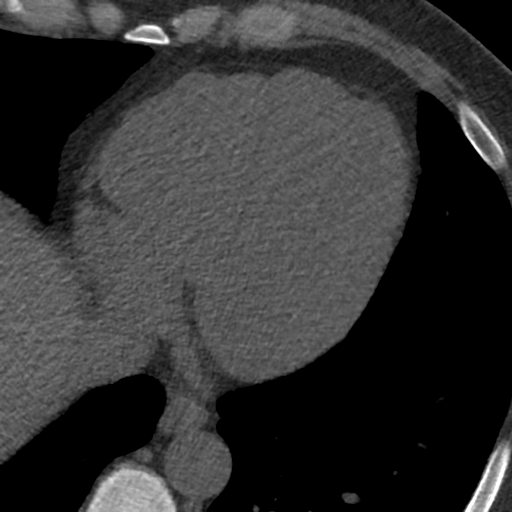
[im 16/35  vessel]
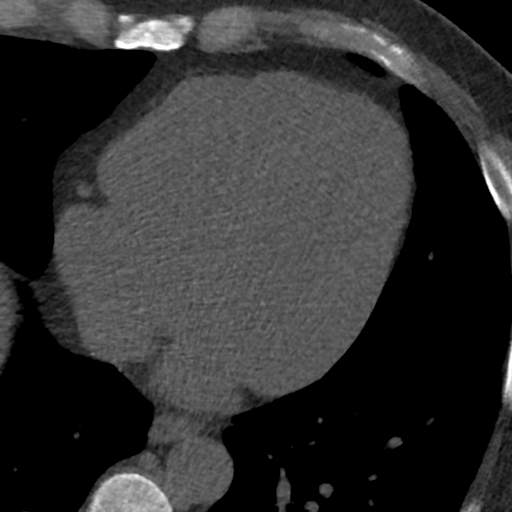
[im 19/35  vessel]
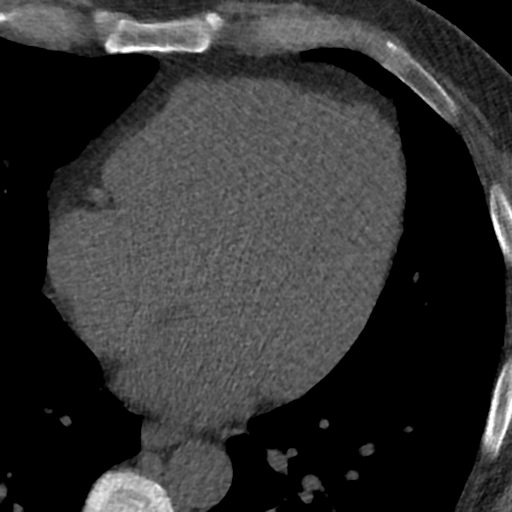
[im 19/35  lung]
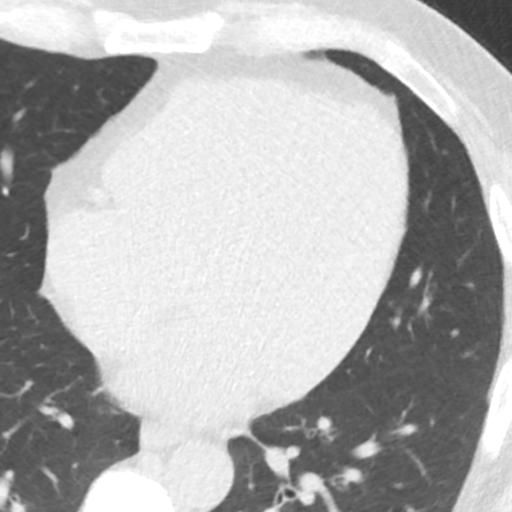
[im 23/35  vessel]
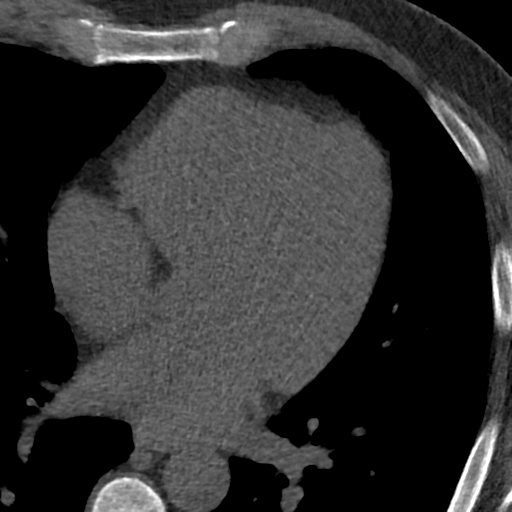
[im 27/35  vessel]
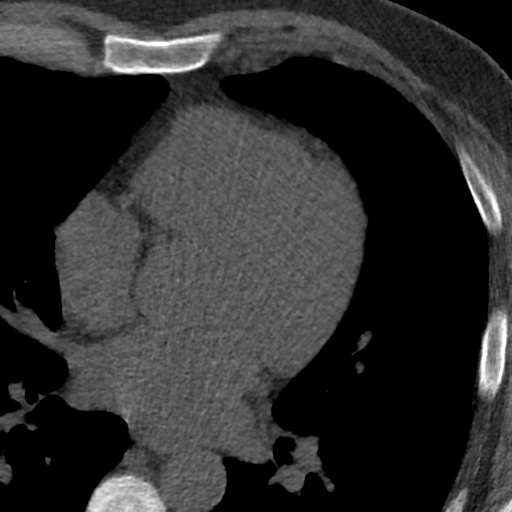
[im 31/35  vessel]
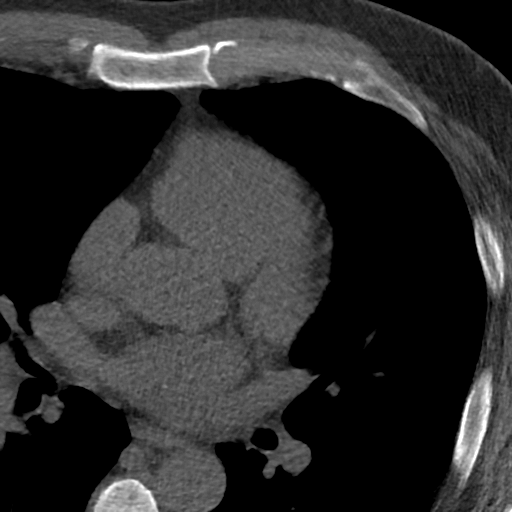

[Series 5: lung st 69 % · axial · 0.76mm/px · z∈[-253,-172]mm · 8 of 35 slices shown]
[im 4/35  lung]
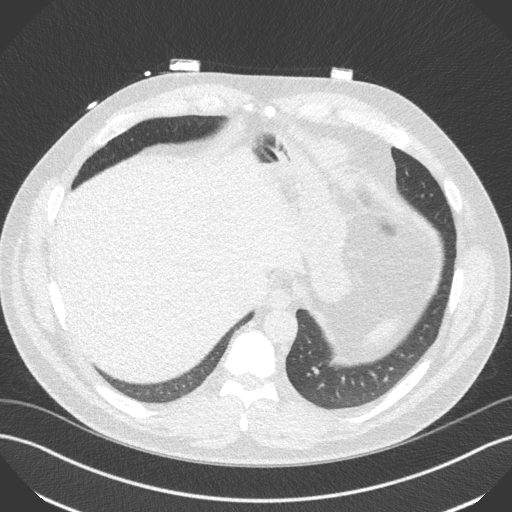
[im 8/35  lung]
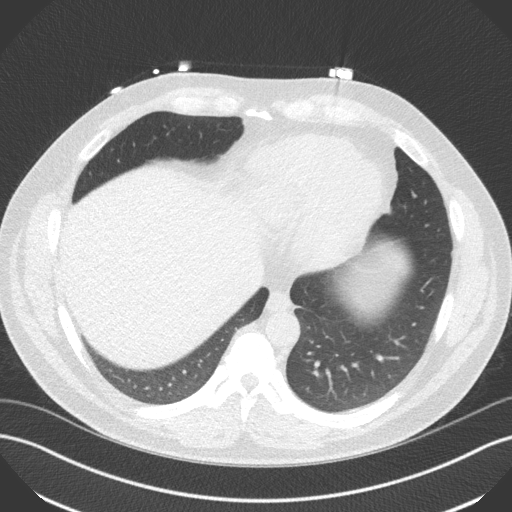
[im 12/35  lung]
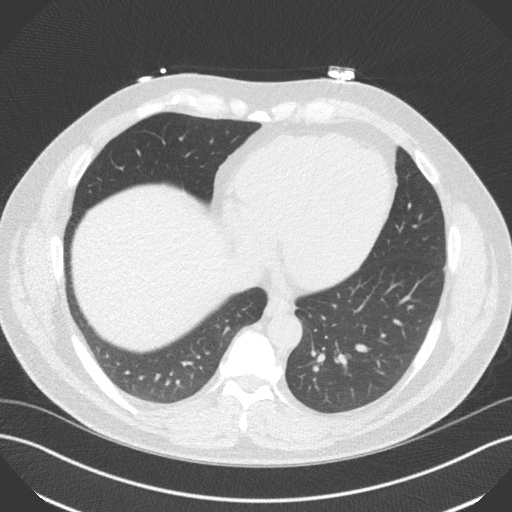
[im 16/35  lung]
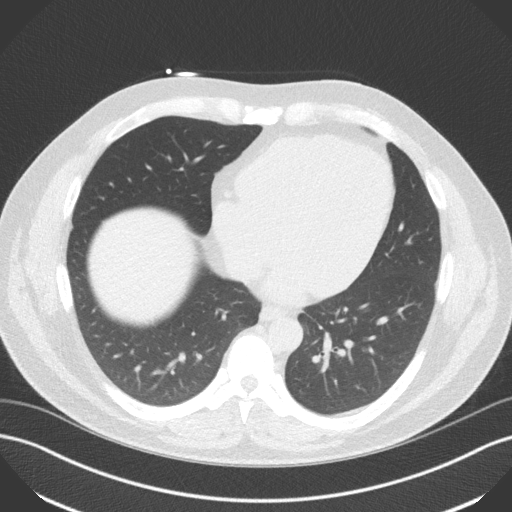
[im 19/35  lung]
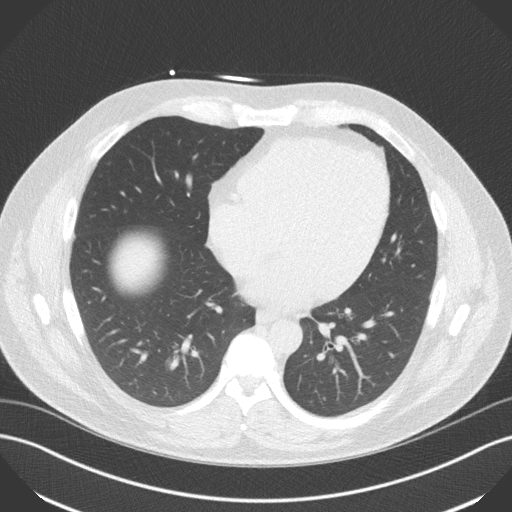
[im 23/35  lung]
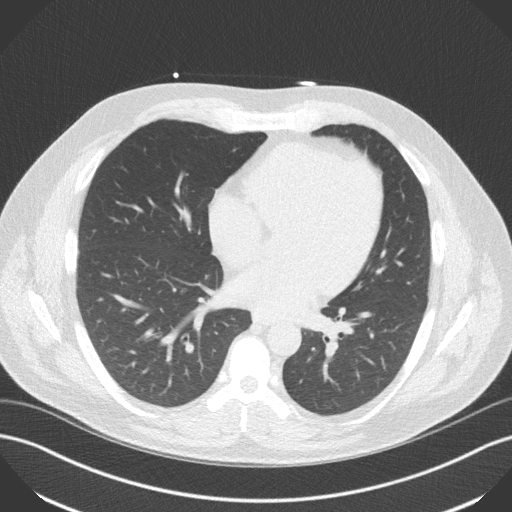
[im 27/35  lung]
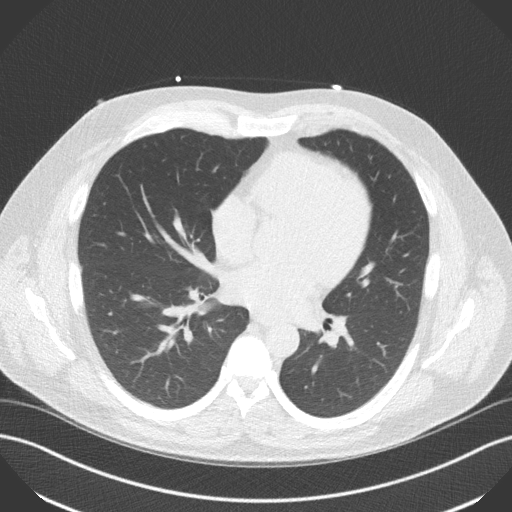
[im 31/35  lung]
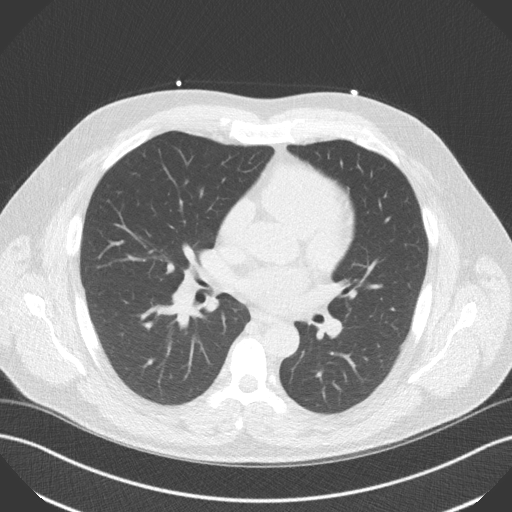

[16 of 20 positions shown; findings below may reference images not displayed]

FINDINGS: Vascular: Heart is normal size.  Visualized aorta normal caliber.

Mediastinum/Nodes: No adenopathy in the lower mediastinum or hila.

Lungs/Pleura: Small subpleural triangular shape right middle lobe
nodule measures 5 mm on image 8. No confluent opacities or
effusions.

Upper Abdomen: Imaging into the upper abdomen shows no acute
findings.

Musculoskeletal: Chest wall soft tissues are unremarkable. No acute
bony abnormality.
IMPRESSION: 5 mm anterior right middle lobe nodule. No follow-up needed if
patient is low-risk. Non-contrast chest CT can be considered in 12
months if patient is high-risk. This recommendation follows the
consensus statement: Guidelines for Management of Incidental
Pulmonary Nodules Detected on CT Images: From the [REDACTED]AL DATA:  Risk stratification

EXAM:
Coronary Calcium Score
FINDINGS: Non-cardiac: See separate report from [REDACTED].

Ascending aorta: Normal diameter 3.2 cm

Pericardium: Normal

Coronary arteries: Calcium noted in distal LM and proximal LAD
IMPRESSION: Coronary calcium score of 178. This was 97 th percentile for age and
sex matched control.

Ntigir Apollinaire

*** End of Addendum ***

## 2020-10-08 ENCOUNTER — Ambulatory Visit: Payer: 59 | Admitting: Cardiovascular Disease

## 2021-03-01 ENCOUNTER — Ambulatory Visit: Payer: 59 | Admitting: Cardiovascular Disease

## 2021-04-09 ENCOUNTER — Other Ambulatory Visit: Payer: Self-pay | Admitting: Cardiovascular Disease

## 2021-05-13 NOTE — Progress Notes (Signed)
? ?Evaluation Performed:  Follow-up visit ? ?Date:  05/27/2021  ? ?ID:  Cory Mcfarland, DOB December 10, 1975, MRN [id number] ? ?PCP:  Shade Flood, MD  ?Cardiologist:  Charlton Haws, MD   ?Electrophysiologist:  None  ? ?Chief Complaint:  Abnormal ECG/HTN/HLD ? ?History of Present Illness:   ? ?Cory Mcfarland is a 46 y.o. male first seen 02/05/18 for abnormal ECG and atypical chest pain  Rx for HLD with lipitor. BP elevated but not placed on Rx. Some rare palpitations and exertional dyspnea Reviewed ECG from 01/05/18 SB PR 238 T wave inversions 3,F no old one to compare to ?  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  ?Married with two girls ages 33 and 70 Originally from [place] and has lived in [place] and [place] ?One daughter at [place] and one at [place]  ?  ?Some anxiety about heart and having an MI. Some atypical chest pains when he ?Gets anxious about his heart  ? ?Normal stress echo 02/11/18 reviewed  ?Calcium Score 02/12/18 178 in distal LM and proximal LAD 97 th percentile for age and sex ? ?Lipitor increased to 40 mg daily LDL was 98 on lower dose now 75-85  ? ?No recurrent chest pain Seems frustrated by COVID and being stuck at home ?Wife teaching elementary music at [employer] ? ?He is very active exercising daily with no symptoms  ? ?No cardiac issues  ? ? ?Past Medical History:  ?Diagnosis Date  ? Anemia   ? Ankle sprain   ? Hyperlipidemia   ? ?Past Surgical History:  ?Procedure Laterality Date  ? HERNIA REPAIR    ?  ? ?Current Meds  ?Medication Sig  ? atorvastatin (LIPITOR) 40 MG tablet TAKE 1 TABLET DAILY AT 6 P.M.  ? sildenafil (VIAGRA) 50 MG tablet Take 0.5-1 tablets (25-50 mg total) by mouth daily as needed for erectile dysfunction.  ? valsartan-hydrochlorothiazide (DIOVAN-HCT) 320-12.5 MG tablet Take 1 tablet by mouth daily. Pt needs to keep upcoming appt in May for further  refills  ?  ? ?Allergies:   Patient has no known allergies.  ? ?Social History  ? ?Tobacco Use  ? Smoking status: Former  ? Smokeless tobacco: Never  ? Tobacco comments:  ?  quit 12 years ago  ?Substance Use Topics  ? Alcohol use: Yes  ?  Alcohol/week: 8.0 standard drinks  ?  Types: 8 Glasses of wine per week  ? Drug use: No  ?  ? ?Family Hx: ?The patient's family history includes Cancer in his father, maternal grandfather, and maternal grandmother; Diabetes in his paternal grandfather; Hyperlipidemia in his mother. ? ?ROS:   ?Please see the history of present illness.    ? ?All other systems reviewed and are negative. ? ? ?Prior CV studies:   ?The following studies were reviewed today: ? ?Stress echo 01/2018 ?Calcium score 01/2018 ? ?Labs/Other Tests and Data Reviewed:   ? ?EKG: 05/27/2021 SR rate 57 insignificant q waves in 3,F  ? ? ?Recent Labs: ?No results found for requested labs within last 8760 hours.  ? ?Recent Lipid Panel ?Lab Results  ?Component Value Date/Time  ? CHOL 158 03/09/2020 09:32 AM  ? TRIG 204 (H) 03/09/2020 09:32 AM  ? HDL 39 (L) 03/09/2020 09:32 AM  ? CHOLHDL 4.1 03/09/2020 09:32 AM  ? CHOLHDL 4.6 04/05/2015 08:16 AM  ?  LDLCALC 84 03/09/2020 09:32 AM  ? ? ?Wt Readings from Last 3 Encounters:  ?05/27/21 229 lb 6.4 oz (104.1 kg)  ?04/26/20 217 lb 9.6 oz (98.7 kg)  ?03/14/20 225 lb (102.1 kg)  ?  ? ?Objective:   ? ?Vital Signs:  BP 122/72   Pulse (!) 57   Ht 6\' 1"  (1.854 m)   Wt 229 lb 6.4 oz (104.1 kg)   SpO2 97%   BMI 30.27 kg/m?   ? ?Affect appropriate ?Healthy:  appears stated age ?HEENT: normal ?Neck supple with no adenopathy ?JVP normal no bruits no thyromegaly ?Lungs clear with no wheezing and good diaphragmatic motion ?Heart:  S1/S2 no murmur, no rub, gallop or click ?PMI normal ?Abdomen: benighn, BS positve, no tenderness, no AAA ?no bruit.  No HSM or HJR ?Distal pulses intact with no bruits ?No edema ?Neuro non-focal ?Skin warm and dry ?No muscular weakness ? ? ?ASSESSMENT & PLAN:    ? ?Chest Pain:  Normal stress echo 02/11/18 Calcium score high for age 28 21 th percentile and involving distal LM and LAD continue ASA statin  ?HTN:  Improved on Diovan / HCTZ Discussed lowering salt, ETOH and coffe consumption  ?HLD:  LDL 84 03/09/20 Continue higher dose lipitor 40 mg daily  ? ? ? ?COVID-19 Education: ?The signs and symptoms of COVID-19 were discussed with the patient and how to seek care for testing (follow up with PCP or arrange E-visit).  The importance of social distancing was discussed today. ? ?Time:   ?Today, I have spent 30 minutes with the patient with telehealth technology discussing the above problems.   ? ? ?Medication Adjustments/Labs and Tests Ordered: ?Current medicines are reviewed at length with the patient today.  Concerns regarding medicines are outlined above.  ? ?Tests Ordered: ?No orders of the defined types were placed in this encounter. ? ? ?Medication Changes: ?No orders of the defined types were placed in this encounter. ? ? ?Disposition:  Follow  in a year  ? ?Signed, ?05/07/20, MD  ?05/27/2021 8:10 AM    ? [facility] ?

## 2021-05-27 ENCOUNTER — Ambulatory Visit (INDEPENDENT_AMBULATORY_CARE_PROVIDER_SITE_OTHER): Payer: 59 | Admitting: Cardiovascular Disease

## 2021-05-27 ENCOUNTER — Encounter: Payer: Self-pay | Admitting: Cardiovascular Disease

## 2021-05-27 VITALS — BP 122/72 | HR 57 | Ht 73.0 in | Wt 229.4 lb

## 2021-05-27 DIAGNOSIS — E785 Hyperlipidemia, unspecified: Secondary | ICD-10-CM | POA: Diagnosis not present

## 2021-05-27 DIAGNOSIS — I10 Essential (primary) hypertension: Secondary | ICD-10-CM

## 2021-05-27 NOTE — Patient Instructions (Signed)

## 2021-08-14 ENCOUNTER — Other Ambulatory Visit: Payer: Self-pay | Admitting: Cardiovascular Disease

## 2021-08-21 ENCOUNTER — Other Ambulatory Visit: Payer: Self-pay | Admitting: Cardiovascular Disease

## 2021-08-21 DIAGNOSIS — E7800 Pure hypercholesterolemia, unspecified: Secondary | ICD-10-CM

## 2021-12-16 ENCOUNTER — Encounter: Payer: Self-pay | Admitting: Family Medicine

## 2022-02-12 ENCOUNTER — Ambulatory Visit (INDEPENDENT_AMBULATORY_CARE_PROVIDER_SITE_OTHER): Payer: 59 | Admitting: Family Medicine

## 2022-02-12 ENCOUNTER — Encounter: Payer: Self-pay | Admitting: Family Medicine

## 2022-02-12 VITALS — BP 128/70 | HR 64 | Temp 97.8°F | Ht 74.0 in | Wt 224.0 lb

## 2022-02-12 DIAGNOSIS — Z1211 Encounter for screening for malignant neoplasm of colon: Secondary | ICD-10-CM

## 2022-02-12 DIAGNOSIS — R7303 Prediabetes: Secondary | ICD-10-CM | POA: Diagnosis not present

## 2022-02-12 DIAGNOSIS — E785 Hyperlipidemia, unspecified: Secondary | ICD-10-CM

## 2022-02-12 DIAGNOSIS — Z0000 Encounter for general adult medical examination without abnormal findings: Secondary | ICD-10-CM

## 2022-02-12 DIAGNOSIS — Z125 Encounter for screening for malignant neoplasm of prostate: Secondary | ICD-10-CM | POA: Diagnosis not present

## 2022-02-12 LAB — COMPREHENSIVE METABOLIC PANEL
ALT: 30 U/L (ref 0–53)
AST: 23 U/L (ref 0–37)
Albumin: 4.9 g/dL (ref 3.5–5.2)
Alkaline Phosphatase: 48 U/L (ref 39–117)
BUN: 13 mg/dL (ref 6–23)
CO2: 30 mEq/L (ref 19–32)
Calcium: 9.9 mg/dL (ref 8.4–10.5)
Chloride: 103 mEq/L (ref 96–112)
Creatinine, Ser: 1.06 mg/dL (ref 0.40–1.50)
GFR: 84.38 mL/min (ref >60.00)
Glucose, Bld: 103 mg/dL — ABNORMAL HIGH (ref 70–99)
Potassium: 4.2 mEq/L (ref 3.5–5.1)
Sodium: 140 mEq/L (ref 135–145)
Total Bilirubin: 0.5 mg/dL (ref 0.2–1.2)
Total Protein: 7.7 g/dL (ref 6.0–8.3)

## 2022-02-12 LAB — LIPID PANEL
Cholesterol: 142 mg/dL (ref 0–200)
HDL: 49.1 mg/dL (ref >39.00)
LDL Cholesterol: 58 mg/dL (ref 0–99)
NonHDL: 92.84
Total CHOL/HDL Ratio: 3
Triglycerides: 172 mg/dL — ABNORMAL HIGH (ref 0.0–149.0)
VLDL: 34.4 mg/dL (ref 0.0–40.0)

## 2022-02-12 LAB — PSA: PSA: 0.61 ng/mL (ref 0.10–4.00)

## 2022-02-12 NOTE — Patient Instructions (Addendum)
If you can find the prior colonoscopy report, have them send it to me or let me know the contact info and we can request it. If not, I did refer you for colonoscopy if unable to find that report.  I would consider restarting aspirin as that seems to have been recommended by cardiology given your previous testing.  No med changes today.  Let me know if refills are needed.  Recheck in 1 year for physical with me, continue follow-up with cardiology as planned.  Take care.    Preventive Care 47-21 Years Old, Male Preventive care refers to lifestyle choices and visits with your health care provider that can promote health and wellness. Preventive care visits are also called wellness exams. What can I expect for my preventive care visit? Counseling During your preventive care visit, your health care provider may ask about your: Medical history, including: Past medical problems. Family medical history. Current health, including: Emotional well-being. Home life and relationship well-being. Sexual activity. Lifestyle, including: Alcohol, nicotine or tobacco, and drug use. Access to firearms. Diet, exercise, and sleep habits. Safety issues such as seatbelt and bike helmet use. Sunscreen use. Work and work Statistician. Physical exam Your health care provider will check your: Height and weight. These may be used to calculate your BMI (body mass index). BMI is a measurement that tells if you are at a healthy weight. Waist circumference. This measures the distance around your waistline. This measurement also tells if you are at a healthy weight and may help predict your risk of certain diseases, such as type 2 diabetes and high blood pressure. Heart rate and blood pressure. Body temperature. Skin for abnormal spots. What immunizations do I need?  Vaccines are usually given at various ages, according to a schedule. Your health care provider will recommend vaccines for you based on your age, medical  history, and lifestyle or other factors, such as travel or where you work. What tests do I need? Screening Your health care provider may recommend screening tests for certain conditions. This may include: Lipid and cholesterol levels. Diabetes screening. This is done by checking your blood sugar (glucose) after you have not eaten for a while (fasting). Hepatitis B test. Hepatitis C test. HIV (human immunodeficiency virus) test. STI (sexually transmitted infection) testing, if you are at risk. Lung cancer screening. Prostate cancer screening. Colorectal cancer screening. Talk with your health care provider about your test results, treatment options, and if necessary, the need for more tests. Follow these instructions at home: Eating and drinking  Eat a diet that includes fresh fruits and vegetables, whole grains, lean protein, and low-fat dairy products. Take vitamin and mineral supplements as recommended by your health care provider. Do not drink alcohol if your health care provider tells you not to drink. If you drink alcohol: Limit how much you have to 0-2 drinks a day. Know how much alcohol is in your drink. In the [place] one drink equals one 12 oz bottle of beer (355 mL), one 5 oz glass of wine (148 mL), or one 1 oz glass of hard liquor (44 mL). Lifestyle Brush your teeth every morning and night with fluoride toothpaste. Floss one time each day. Exercise for at least 30 minutes 5 or more days each week. Do not use any products that contain nicotine or tobacco. These products include cigarettes, chewing tobacco, and vaping devices, such as e-cigarettes. If you need help quitting, ask your health care provider. Do not use drugs. If you are sexually  active, practice safe sex. Use a condom or other form of protection to prevent STIs. Take aspirin only as told by your health care provider. Make sure that you understand how much to take and what form to take. Work with your health care  provider to find out whether it is safe and beneficial for you to take aspirin daily. Find healthy ways to manage stress, such as: Meditation, yoga, or listening to music. Journaling. Talking to a trusted person. Spending time with friends and family. Minimize exposure to UV radiation to reduce your risk of skin cancer. Safety Always wear your seat belt while driving or riding in a vehicle. Do not drive: If you have been drinking alcohol. Do not ride with someone who has been drinking. When you are tired or distracted. While texting. If you have been using any mind-altering substances or drugs. Wear a helmet and other protective equipment during sports activities. If you have firearms in your house, make sure you follow all gun safety procedures. What's next? Go to your health care provider once a year for an annual wellness visit. Ask your health care provider how often you should have your eyes and teeth checked. Stay up to date on all vaccines. This information is not intended to replace advice given to you by your health care provider. Make sure you discuss any questions you have with your health care provider. Document Revised: 07/11/2020 Document Reviewed: 07/11/2020 Elsevier Patient Education  [place].

## 2022-02-12 NOTE — Progress Notes (Addendum)
Subjective:  Patient ID: Cory Mcfarland, male    DOB: 1975-06-08  Age: 47 y.o. MRN: [id number]  CC:  Chief Complaint  Patient presents with   Annual Exam    Pt is fasting     HPI Cory Mcfarland presents for Annual Exam  No new health changes. No specific concerns.   Hypertension: Diovan hct 320/12.5mg  QD.  Cardiologist Dr. Eden Emms, appointment 05/27/2021 normal stress echo in January 2020, coronary calcium score high for age 21, 78 percentile, on lipid treatment as below.  He was continued on aspirin and statin.  Sodium avoidance for hypertension. Home readings: 120-130/70-75 Not on ASA. Plans to restart.  BP Readings from Last 3 Encounters:  02/12/22 128/70  05/27/21 122/72  04/26/20 118/80   Lab Results  Component Value Date   CREATININE 1.02 03/23/2020   Hyperlipidemia: Lipitor 40mg  qd.  No new myalgias/side effects. Fasting today.  Lab Results  Component Value Date   CHOL 158 03/09/2020   HDL 39 (L) 03/09/2020   LDLCALC 84 03/09/2020   TRIG 204 (H) 03/09/2020   CHOLHDL 4.1 03/09/2020   Lab Results  Component Value Date   ALT 38 03/09/2020   AST 27 03/09/2020   ALKPHOS 48 03/09/2020   BILITOT 0.6 03/09/2020   Erectile dysfunction Cialis 20 mg, full pill.  No vision or hearing changes, no side effects. Works ok overall.no refill needed currently.   Prediabetes: Frequent exercise as below.  Only borderline glucose of 102 in 2022, normal previous testing. Lab Results  Component Value Date   HGBA1C 5.9 (A) 03/09/2020   Wt Readings from Last 3 Encounters:  02/12/22 224 lb (101.6 kg)  05/27/21 229 lb 6.4 oz (104.1 kg)  04/26/20 217 lb 9.6 oz (98.7 kg)        02/12/2022    8:04 AM 03/14/2020    1:49 PM 01/05/2018    3:04 PM 11/21/2016    4:00 PM 09/25/2016    9:46 AM  Depression screen PHQ 2/9  Decreased Interest 0 0 0 0 0  Down, Depressed, Hopeless 0 0 0 0 0  PHQ - 2 Score 0 0 0 0 0  Altered sleeping 0      Tired, decreased energy 0      Change in  appetite 0      Feeling bad or failure about yourself  0      Trouble concentrating 0      Moving slowly or fidgety/restless 0      Suicidal thoughts 0      PHQ-9 Score 0        Health Maintenance  Topic Date Due   COLONOSCOPY (Pts 45-33yrs Insurance coverage will need to be confirmed)  Never done   COVID-19 Vaccine (4 - 2023-24 season) 02/28/2022 (Originally 09/27/2021)   HIV Screening  02/13/2023 (Originally [birth year]/1990)   DTaP/Tdap/Td (2 - Td or Tdap) 09/26/2026   HPV VACCINES  Aged Out   INFLUENZA VACCINE  Discontinued   Hepatitis C Screening  Discontinued  Screening options with colonoscopy versus Cologuard discussed. Discussed timing of repeat testing intervals if normal, as well as potential need for diagnostic Colonoscopy if positive Cologuard. Understanding expressed, and chose Colonoscopy. Aunt with colon CA. Last colonoscopy 7-8 years ago in 09/28/2026.? Repeat 10 yrs  Addendum, after office visit patient sent MyChart message with attached colonoscopy report.Colonoscopy performed 08/23/2013 in 08/25/2013.   Nonbleeding internal hemorrhoids.  Otherwise normal.  Plan for repeat colonoscopy at age 44 for screening purposes.  However as  that was prior to change on screening starting age 3, we will still proceed with GI referral to decide on repeat colonoscopy and timing.  Prostate: does not have family history of prostate cancer, father with brain, kidney, lymph nodes - primary renal. Passed in 87 at age 76yo.  The natural history of prostate cancer and ongoing controversy regarding screening and potential treatment outcomes of prostate cancer has been discussed with the patient. The meaning of a false positive PSA and a false negative PSA has been discussed. He indicates understanding of the limitations of this screening test and wishes to proceed with screening PSA testing. Lab Results  Component Value Date   PSA1 0.7 03/14/2020  Has seen derm prior - no skin CA, plans new appt, will let  me know if referral needed.   Immunization History  Administered Date(s) Administered   Influenza, Seasonal, Injecte, Preservative Fre 11/28/2015   Influenza,inj,Quad PF,6+ Mos 01/05/2018   Influenza-Unspecified 11/28/2015   PFIZER(Purple Top)SARS-COV-2 Vaccination 04/21/2019, 05/19/2019, 12/24/2019   Tdap 09/25/2016  Covid infection in August. Updated booster discussed.  Declined flu vaccine.  HIV/HEp C - reports negative testing years ago - declines repeat.  No new sexual partners.   No results found. Glasses. Optho - 4 months ago. Doing well  Dental:Within Last 6 months  Alcohol: 6-9 per week.  Tobacco: none.   Exercise: 6-7 days per week, cycling, some weights, rowing.   History Patient Active Problem List   Diagnosis Date Noted   Essential hypertension 03/14/2020   High cholesterol 12/24/2016   Avulsion fracture of lateral malleolus of right fibula, closed, initial encounter 08/18/2016   Sprain of metacarpophalangeal joint of right thumb, initial encounter 02/18/2016   Past Medical History:  Diagnosis Date   Anemia    Ankle sprain    Hyperlipidemia    Past Surgical History:  Procedure Laterality Date   HERNIA REPAIR     No Known Allergies Prior to Admission medications   Medication Sig Start Date End Date Taking? Authorizing Provider  atorvastatin (LIPITOR) 40 MG tablet TAKE 1 TABLET DAILY AT 6 P.M. 08/21/21  Yes Wendall Stade, MD  tadalafil (CIALIS) 20 MG tablet Take 20 mg by mouth daily as needed for erectile dysfunction.   Yes [provider]  valsartan-hydrochlorothiazide (DIOVAN-HCT) 320-12.5 MG tablet Take 1 tablet by mouth daily. 08/14/21  Yes Wendall Stade, MD   Social History   Socioeconomic History   Marital status: Married    Spouse name: Not on file   Number of children: Not on file   Years of education: Not on file   Highest education level: Not on file  Occupational History   Not on file  Tobacco Use   Smoking status: Former    Smokeless tobacco: Never   Tobacco comments:    Quit 2003 ~  Substance and Sexual Activity   Alcohol use: Yes    Alcohol/week: 8.0 standard drinks of alcohol    Types: 8 Glasses of wine per week   Drug use: Not Currently    Types: Marijuana    Comment: on occasion with friends in legal states   Sexual activity: Yes  Other Topics Concern   Not on file  Social History Narrative   Not on file   Social Determinants of Health   Financial Resource Strain: Not on file  Food Insecurity: Not on file  Transportation Needs: Not on file  Physical Activity: Not on file  Stress: Not on file  Social Connections: Not  on file  Intimate Partner Violence: Not on file    Review of Systems  13 point review of systems per patient health survey noted.  Negative other than as indicated above or in HPI.   Objective:   Vitals:   02/12/22 0807  BP: 128/70  Pulse: 64  Temp: 97.8 F (36.6 C)  TempSrc: Oral  SpO2: 96%  Weight: 224 lb (101.6 kg)  Height: 6\' 2"  (1.88 m)     Physical Exam Vitals reviewed.  Constitutional:      Appearance: He is well-developed.  HENT:     Head: Normocephalic and atraumatic.     Right Ear: External ear normal.     Left Ear: External ear normal.  Eyes:     Conjunctiva/sclera: Conjunctivae normal.     Pupils: Pupils are equal, round, and reactive to light.  Neck:     Thyroid: No thyromegaly.  Cardiovascular:     Rate and Rhythm: Normal rate and regular rhythm.     Heart sounds: Normal heart sounds.  Pulmonary:     Effort: Pulmonary effort is normal. No respiratory distress.     Breath sounds: Normal breath sounds. No wheezing.  Abdominal:     General: There is no distension.     Palpations: Abdomen is soft.     Tenderness: There is no abdominal tenderness.  Musculoskeletal:        General: No tenderness. Normal range of motion.     Cervical back: Normal range of motion and neck supple.  Lymphadenopathy:     Cervical: No cervical adenopathy.   Skin:    General: Skin is warm and dry.  Neurological:     Mental Status: He is alert and oriented to person, place, and time.     Deep Tendon Reflexes: Reflexes are normal and symmetric.  Psychiatric:        Behavior: Behavior normal.      Assessment & Plan:  Zyere Jiminez is a 47 y.o. male . Annual physical exam - Plan: Ambulatory referral to Gastroenterology, Comprehensive metabolic panel, Lipid panel, PSA  - -anticipatory guidance as below in AVS, screening labs above. Health maintenance items as above in HPI discussed/recommended as applicable.   -Okay to refill Cialis if needed but did discuss option to meet with urology if that is not effective.  Screening for prostate cancer - Plan: PSA  Hyperlipidemia, unspecified hyperlipidemia type - Plan: Comprehensive metabolic panel, Lipid panel  -Tolerating current regimen of statin, continue same.  Discussed restarting aspirin given prior CCS.  Continue follow-up with cardiology.  Prediabetes - Plan: Comprehensive metabolic panel  - prior borderline A1c. Will check glucose on CMP, if elevated consider further testing. Continue exercise.   Screening for colon cancer - Plan: Ambulatory referral to Gastroenterology  -Referral placed for colonoscopy, but he will check with his previous provider in 49 to see if that colonoscopy has been within 10 years.  If so can cancel colonoscopy referral, but would like to request records.  He will check into it.  No orders of the defined types were placed in this encounter.  Patient Instructions  If you can find the prior colonoscopy report, have them send it to me or let me know the contact info and we can request it. If not, I did refer you for colonoscopy if unable to find that report.  I would consider restarting aspirin as that seems to have been recommended by cardiology given your previous testing.  No med changes today.  Let  me know if refills are needed.  Recheck in 1 year for physical  with me, continue follow-up with cardiology as planned.  Take care.    Preventive Care 39-92 Years Old, Male Preventive care refers to lifestyle choices and visits with your health care provider that can promote health and wellness. Preventive care visits are also called wellness exams. What can I expect for my preventive care visit? Counseling During your preventive care visit, your health care provider may ask about your: Medical history, including: Past medical problems. Family medical history. Current health, including: Emotional well-being. Home life and relationship well-being. Sexual activity. Lifestyle, including: Alcohol, nicotine or tobacco, and drug use. Access to firearms. Diet, exercise, and sleep habits. Safety issues such as seatbelt and bike helmet use. Sunscreen use. Work and work Statistician. Physical exam Your health care provider will check your: Height and weight. These may be used to calculate your BMI (body mass index). BMI is a measurement that tells if you are at a healthy weight. Waist circumference. This measures the distance around your waistline. This measurement also tells if you are at a healthy weight and may help predict your risk of certain diseases, such as type 2 diabetes and high blood pressure. Heart rate and blood pressure. Body temperature. Skin for abnormal spots. What immunizations do I need?  Vaccines are usually given at various ages, according to a schedule. Your health care provider will recommend vaccines for you based on your age, medical history, and lifestyle or other factors, such as travel or where you work. What tests do I need? Screening Your health care provider may recommend screening tests for certain conditions. This may include: Lipid and cholesterol levels. Diabetes screening. This is done by checking your blood sugar (glucose) after you have not eaten for a while (fasting). Hepatitis B test. Hepatitis C test. HIV  (human immunodeficiency virus) test. STI (sexually transmitted infection) testing, if you are at risk. Lung cancer screening. Prostate cancer screening. Colorectal cancer screening. Talk with your health care provider about your test results, treatment options, and if necessary, the need for more tests. Follow these instructions at home: Eating and drinking  Eat a diet that includes fresh fruits and vegetables, whole grains, lean protein, and low-fat dairy products. Take vitamin and mineral supplements as recommended by your health care provider. Do not drink alcohol if your health care provider tells you not to drink. If you drink alcohol: Limit how much you have to 0-2 drinks a day. Know how much alcohol is in your drink. In the [place] one drink equals one 12 oz bottle of beer (355 mL), one 5 oz glass of wine (148 mL), or one 1 oz glass of hard liquor (44 mL). Lifestyle Brush your teeth every morning and night with fluoride toothpaste. Floss one time each day. Exercise for at least 30 minutes 5 or more days each week. Do not use any products that contain nicotine or tobacco. These products include cigarettes, chewing tobacco, and vaping devices, such as e-cigarettes. If you need help quitting, ask your health care provider. Do not use drugs. If you are sexually active, practice safe sex. Use a condom or other form of protection to prevent STIs. Take aspirin only as told by your health care provider. Make sure that you understand how much to take and what form to take. Work with your health care provider to find out whether it is safe and beneficial for you to take aspirin daily. Find healthy ways to manage  stress, such as: Meditation, yoga, or listening to music. Journaling. Talking to a trusted person. Spending time with friends and family. Minimize exposure to UV radiation to reduce your risk of skin cancer. Safety Always wear your seat belt while driving or riding in a vehicle. Do  not drive: If you have been drinking alcohol. Do not ride with someone who has been drinking. When you are tired or distracted. While texting. If you have been using any mind-altering substances or drugs. Wear a helmet and other protective equipment during sports activities. If you have firearms in your house, make sure you follow all gun safety procedures. What's next? Go to your health care provider once a year for an annual wellness visit. Ask your health care provider how often you should have your eyes and teeth checked. Stay up to date on all vaccines. This information is not intended to replace advice given to you by your health care provider. Make sure you discuss any questions you have with your health care provider. Document Revised: 07/11/2020 Document Reviewed: 07/11/2020 Elsevier Patient Education  Calvin,   Merri Ray, MD [place] 02/12/22 8:38 AM

## 2022-03-07 ENCOUNTER — Encounter: Payer: Self-pay | Admitting: Family Medicine

## 2022-03-14 ENCOUNTER — Telehealth: Payer: Self-pay | Admitting: Gastroenterology

## 2022-03-14 NOTE — Telephone Encounter (Signed)
Hi Dr. Silverio Decamp,   Supervising Provider: 03/14/22-AM   We received a referral for patient to have another colonoscopy. The patient has GI history from [place], [place]. All records were obtained for you to review and advise on scheduling. Looks like patient had an EGD and Colonoscopy in 2015.   Thanks

## 2022-03-17 ENCOUNTER — Encounter: Payer: Self-pay | Admitting: Gastroenterology

## 2022-03-17 NOTE — Telephone Encounter (Signed)
Called patient to schedule left voicemail. 

## 2022-05-23 ENCOUNTER — Encounter: Payer: 59 | Admitting: Gastroenterology

## 2022-06-11 ENCOUNTER — Ambulatory Visit (AMBULATORY_SURGERY_CENTER): Payer: 59 | Admitting: *Deleted

## 2022-06-11 VITALS — Ht 74.0 in | Wt 225.0 lb

## 2022-06-11 DIAGNOSIS — Z1211 Encounter for screening for malignant neoplasm of colon: Secondary | ICD-10-CM

## 2022-06-11 MED ORDER — NA SULFATE-K SULFATE-MG SULF 17.5-3.13-1.6 GM/177ML PO SOLN: 1.0000 | Freq: Once | ORAL | 0 refills | Status: AC

## 2022-06-11 NOTE — Progress Notes (Signed)
Pt's name and DOB verified at the beginning of the pre-visit.  Pt denies any difficulty with ambulating,sitting, laying down or rolling side to side Gave both [facility] main # and MD on call # prior to instructions.  No egg or soy allergy known to patient  No issues known to pt with past sedation with any surgeries or procedures Patient denies ever being intubated Pt has no issues moving head neck or swallowing No FH of Malignant Hyperthermia Pt is not on diet pills Pt is not on home 02  Pt is not on blood thinners  Pt denies issues with constipation  Pt has frequent issues with constipation RN instructed pt to use Miralax per bottles instructions a week before prep days. Pt states they will Pt is not on dialysis Pt denise any abnormal heart rhythms  Pt denies any upcoming cardiac testing Pt encouraged to use to use Singlecare or Goodrx to reduce cost  Patient's chart reviewed by Cathlyn Parsons CNRA prior to pre-visit and patient appropriate for the [facility].  Pre-visit completed and red dot placed by patient's name on their procedure day (on provider's schedule).  . Visit by phone Pt states weight is 225 lb Instructed pt why it is important to and  to call if they have any changes in health or new medications. Directed them to the # given and on instructions.   Pt states they will.  Instructions reviewed with pt and pt states understanding. Instructed to review again prior to procedure. Pt states they will.  Instructions sent by mail with coupon and by my chart

## 2022-06-24 ENCOUNTER — Encounter: Payer: Self-pay | Admitting: Family Medicine

## 2022-06-24 DIAGNOSIS — Z1283 Encounter for screening for malignant neoplasm of skin: Secondary | ICD-10-CM

## 2022-06-30 ENCOUNTER — Encounter: Payer: Self-pay | Admitting: Gastroenterology

## 2022-07-09 ENCOUNTER — Ambulatory Visit (AMBULATORY_SURGERY_CENTER): Payer: 59 | Admitting: Gastroenterology

## 2022-07-09 ENCOUNTER — Encounter: Payer: Self-pay | Admitting: Gastroenterology

## 2022-07-09 ENCOUNTER — Other Ambulatory Visit: Payer: Self-pay | Admitting: Gastroenterology

## 2022-07-09 VITALS — BP 108/55 | HR 55 | Temp 97.5°F | Resp 11 | Ht 74.0 in | Wt 225.0 lb

## 2022-07-09 DIAGNOSIS — Z1211 Encounter for screening for malignant neoplasm of colon: Secondary | ICD-10-CM

## 2022-07-09 DIAGNOSIS — D123 Benign neoplasm of transverse colon: Secondary | ICD-10-CM | POA: Diagnosis not present

## 2022-07-09 DIAGNOSIS — D122 Benign neoplasm of ascending colon: Secondary | ICD-10-CM | POA: Diagnosis not present

## 2022-07-09 DIAGNOSIS — K635 Polyp of colon: Secondary | ICD-10-CM | POA: Diagnosis not present

## 2022-07-09 MED ORDER — SODIUM CHLORIDE 0.9 % IV SOLN
500.0000 mL | Freq: Once | INTRAVENOUS | Status: DC
Start: 2022-07-09 — End: 2022-07-09

## 2022-07-09 NOTE — Progress Notes (Signed)
[facility] History and Physical   Primary Care Physician:  Shade Flood, MD   Reason for Procedure:  Colorectal cancer screening  Plan:    Screening colonoscopy with possible interventions as needed     HPI: Cory Mcfarland is a very pleasant 47 y.o. male here for screening colonoscopy. Denies any nausea, vomiting, abdominal pain, melena or bright red blood per rectum  The risks and benefits as well as alternatives of endoscopic procedure(s) have been discussed and reviewed. All questions answered. The patient agrees to proceed.    Past Medical History:  Diagnosis Date   Anemia    Ankle sprain    Hyperlipidemia    Hypertension     Past Surgical History:  Procedure Laterality Date   COLONOSCOPY     hemorroid removal     8 years    Prior to Admission medications   Medication Sig Start Date End Date Taking? Authorizing Provider  atorvastatin (LIPITOR) 40 MG tablet TAKE 1 TABLET DAILY AT 6 P.M. 08/21/21  Yes Wendall Stade, MD  Multiple Vitamin (MULTIVITAMIN) tablet Take 1 tablet by mouth daily.   Yes [provider]  valsartan-hydrochlorothiazide (DIOVAN-HCT) 320-12.5 MG tablet Take 1 tablet by mouth daily. 08/14/21  Yes Wendall Stade, MD  tadalafil (CIALIS) 20 MG tablet Take 20 mg by mouth daily as needed for erectile dysfunction.    [provider]    Current Outpatient Medications  Medication Sig Dispense Refill   atorvastatin (LIPITOR) 40 MG tablet TAKE 1 TABLET DAILY AT 6 P.M. 90 tablet 3   Multiple Vitamin (MULTIVITAMIN) tablet Take 1 tablet by mouth daily.     valsartan-hydrochlorothiazide (DIOVAN-HCT) 320-12.5 MG tablet Take 1 tablet by mouth daily. 90 tablet 3   tadalafil (CIALIS) 20 MG tablet Take 20 mg by mouth daily as needed for erectile dysfunction.     Current Facility-Administered Medications  Medication Dose Route Frequency Provider Last Rate Last Admin   0.9 %  sodium chloride infusion  500 mL Intravenous Once  Napoleon Form, MD        Allergies as of 07/09/2022   (No Known Allergies)    Family History  Problem Relation Age of Onset   Hyperlipidemia Mother    Cancer Father    Colon cancer Paternal Aunt    Cancer Maternal Grandmother    Cancer Maternal Grandfather    Diabetes Paternal Grandfather    Colon polyps Neg Hx    Esophageal cancer Neg Hx    Rectal cancer Neg Hx    Stomach cancer Neg Hx     Social History   Socioeconomic History   Marital status: Married    Spouse name: Not on file   Number of children: Not on file   Years of education: Not on file   Highest education level: Not on file  Occupational History   Not on file  Tobacco Use   Smoking status: Former   Smokeless tobacco: Never   Tobacco comments:    Quit 2003 ~  Vaping Use   Vaping Use: Never used  Substance and Sexual Activity   Alcohol use: Yes    Alcohol/week: 8.0 standard drinks of alcohol    Types: 8 Glasses of wine per week   Drug use: Not Currently    Types: Marijuana    Comment: on occasion with friends in legal states   Sexual activity: Yes  Other Topics Concern   Not on file  Social History Narrative   Not on  file   Social Determinants of Health   Financial Resource Strain: Not on file  Food Insecurity: Not on file  Transportation Needs: Not on file  Physical Activity: Not on file  Stress: Not on file  Social Connections: Not on file  Intimate Partner Violence: Not on file    Review of Systems:  All other review of systems negative except as mentioned in the HPI.  Physical Exam: Vital signs in last 24 hours: Blood Pressure 134/76   Pulse 60   Temperature (Abnormal) 97.5 F (36.4 C) (Temporal)   Respiration 12   Height 6\' 2"  (1.88 m)   Weight 225 lb (102.1 kg)   Oxygen Saturation 97%   Body Mass Index 28.89 kg/m  General:   Alert, NAD Lungs:  Clear .   Heart:  Regular rate and rhythm Abdomen:  Soft, nontender and nondistended. Neuro/Psych:  Alert and  cooperative. Normal mood and affect. A and O x 3  Reviewed labs, radiology imaging, old records and pertinent past GI work up  Patient is appropriate for planned procedure(s) and anesthesia in an ambulatory setting   K. Scherry Ran , MD [phone]

## 2022-07-09 NOTE — Progress Notes (Signed)
Uneventful anesthetic. Report to pacu rn. Vss. Care resumed by rn. 

## 2022-07-09 NOTE — Patient Instructions (Signed)
Please read handouts provided. Continue present medications. Await pathology results.   YOU HAD AN ENDOSCOPIC PROCEDURE TODAY AT THE [facility]:   Refer to the procedure report that was given to you for any specific questions about what was found during the examination.  If the procedure report does not answer your questions, please call your gastroenterologist to clarify.  If you requested that your care partner not be given the details of your procedure findings, then the procedure report has been included in a sealed envelope for you to review at your convenience later.  YOU SHOULD EXPECT: Some feelings of bloating in the abdomen. Passage of more gas than usual.  Walking can help get rid of the air that was put into your GI tract during the procedure and reduce the bloating. If you had a lower endoscopy (such as a colonoscopy or flexible sigmoidoscopy) you may notice spotting of blood in your stool or on the toilet paper. If you underwent a bowel prep for your procedure, you may not have a normal bowel movement for a few days.  Please Note:  You might notice some irritation and congestion in your nose or some drainage.  This is from the oxygen used during your procedure.  There is no need for concern and it should clear up in a day or so.  SYMPTOMS TO REPORT IMMEDIATELY:  Following lower endoscopy (colonoscopy or flexible sigmoidoscopy):  Excessive amounts of blood in the stool  Significant tenderness or worsening of abdominal pains  Swelling of the abdomen that is new, acute  Fever of 100F or higher  For urgent or emergent issues, a gastroenterologist can be reached at any hour by calling [phone]. Do not use MyChart messaging for urgent concerns.    DIET:  We do recommend a small meal at first, but then you may proceed to your regular diet.  Drink plenty of fluids but you should avoid alcoholic beverages for 24 hours.  ACTIVITY:  You should plan to take it easy for  the rest of today and you should NOT DRIVE or use heavy machinery until tomorrow (because of the sedation medicines used during the test).    FOLLOW UP: Our staff will call the number listed on your records the next business day following your procedure.  We will call around 7:15- 8:00 am to check on you and address any questions or concerns that you may have regarding the information given to you following your procedure. If we do not reach you, we will leave a message.     If any biopsies were taken you will be contacted by phone or by letter within the next 1-3 weeks.  Please call us at [phone] if you have not heard about the biopsies in 3 weeks.    SIGNATURES/CONFIDENTIALITY: You and/or your care partner have signed paperwork which will be entered into your electronic medical record.  These signatures attest to the fact that that the information above on your After Visit Summary has been reviewed and is understood.  Full responsibility of the confidentiality of this discharge information lies with you and/or your care-partner. 

## 2022-07-09 NOTE — Progress Notes (Signed)
Vitals-Cory Mcfarland  Pt's states no medical or surgical changes since previsit or office visit.  Called to room to assist during endoscopic procedure.  Patient ID and intended procedure confirmed with present staff. Received instructions for my participation in the procedure from the performing physician.  

## 2022-07-09 NOTE — Op Note (Signed)
[facility] Patient Name: Cory Mcfarland Procedure Date: 07/09/2022 7:22 AM MRN: [id number] Endoscopist: Napoleon Form , MD, [id number] Age: 47 Referring MD:  Date of Birth: 07-12-1975 Gender: Male Account #: [id number] Procedure:                Colonoscopy Indications:              Screening for colorectal malignant neoplasm Medicines:                Monitored Anesthesia Care Procedure:                Pre-Anesthesia Assessment:                           - Prior to the procedure, a History and Physical                            was performed, and patient medications and                            allergies were reviewed. The patient's tolerance of                            previous anesthesia was also reviewed. The risks                            and benefits of the procedure and the sedation                            options and risks were discussed with the patient.                            All questions were answered, and informed consent                            was obtained. Prior Anticoagulants: The patient has                            taken no anticoagulant or antiplatelet agents. ASA                            Grade Assessment: II - A patient with mild systemic                            disease. After reviewing the risks and benefits,                            the patient was deemed in satisfactory condition to                            undergo the procedure.                           After obtaining informed consent, the colonoscope  was passed under direct vision. Throughout the                            procedure, the patient's blood pressure, pulse, and                            oxygen saturations were monitored continuously. The                            Olympus PCF-H190DL ([id number]) Colonoscope was                            introduced through the anus and advanced to the the                            cecum,  identified by appendiceal orifice and                            ileocecal valve. The colonoscopy was performed                            without difficulty. The patient tolerated the                            procedure well. The quality of the bowel                            preparation was good. The ileocecal valve,                            appendiceal orifice, and rectum were photographed. Scope In: 8:06:59 AM Scope Out: 8:22:19 AM Scope Withdrawal Time: 0 hours 7 minutes 45 seconds  Total Procedure Duration: 0 hours 15 minutes 20 seconds  Findings:                 The perianal and digital rectal examinations were                            normal.                           Two sessile polyps were found in the transverse                            colon and ascending colon. The polyps were 3 to 5                            mm in size. These polyps were removed with a cold                            snare. Resection and retrieval were complete.                           Internal hemorrhoids were found during  retroflexion. The hemorrhoids were medium-sized. Complications:            No immediate complications. Estimated Blood Loss:     Estimated blood loss was minimal. Impression:               - Two 3 to 5 mm polyps in the transverse colon and                            in the ascending colon, removed with a cold snare.                            Resected and retrieved.                           - Internal hemorrhoids.                           - The GI Genius (intelligent endoscopy module),                            computer-aided polyp detection system powered by AI                            was utilized to detect colorectal polyps through                            enhanced visualization during colonoscopy. Recommendation:           - Resume previous diet.                           - Continue present medications.                           - Await  pathology results.                           - Repeat colonoscopy in 5-10 years for surveillance                            based on pathology results. Napoleon Form, MD 07/09/2022 8:39:52 AM This report has been signed electronically.

## 2022-07-10 ENCOUNTER — Telehealth: Payer: Self-pay | Admitting: *Deleted

## 2022-07-10 NOTE — Telephone Encounter (Signed)
  Follow up Call-     07/09/2022    7:28 AM 07/09/2022    7:18 AM  Call back number  Post procedure Call Back phone  # [phone]   Permission to leave phone message  Yes     Patient questions:  Do you have a fever, pain , or abdominal swelling? No. Pain Score  0 *  Have you tolerated food without any problems? Yes.    Have you been able to return to your normal activities? Yes.    Do you have any questions about your discharge instructions: Diet   No. Medications  No. Follow up visit  No.  Do you have questions or concerns about your Care? No.  Actions: * If pain score is 4 or above: No action needed, pain <4.

## 2022-07-14 ENCOUNTER — Encounter: Payer: Self-pay | Admitting: Gastroenterology

## 2022-07-14 NOTE — Telephone Encounter (Signed)
Dr Lavon Paganini--  Would patient's internal hemorrhoids seen on recent colonoscopy be amendable to hemorrhoidal banding if patient is in agreement?

## 2022-07-17 ENCOUNTER — Encounter: Payer: Self-pay | Admitting: Gastroenterology

## 2022-07-17 NOTE — Telephone Encounter (Signed)
Probable the patient with excessive straining during defecation, avoid sitting on commode for more than 2 to 3 minutes at a time during defecation, can use Benefiber 1 teaspoon 2-3 times daily with meals to improve evacuation.  If starts developing any symptoms from hemorrhoids, can consider hemorrhoidal band ligation but do not recommend it if he has no symptoms.

## 2022-08-11 ENCOUNTER — Other Ambulatory Visit: Payer: Self-pay | Admitting: Cardiovascular Disease

## 2022-08-11 ENCOUNTER — Other Ambulatory Visit: Payer: Self-pay | Admitting: Family Medicine

## 2022-08-11 ENCOUNTER — Other Ambulatory Visit: Payer: Self-pay

## 2022-08-11 MED ORDER — TADALAFIL 20 MG PO TABS
20.0000 mg | ORAL_TABLET | Freq: Every day | ORAL | 5 refills | Status: DC | PRN
  Filled 2022-08-11: qty 10, 10d supply, fill #0

## 2022-08-11 NOTE — Telephone Encounter (Signed)
Erectile dysfunction discussed at January 17 visit.  Refill ordered.

## 2022-08-11 NOTE — Telephone Encounter (Signed)
Pt is asking for a refill on this medication . However though he takes it it says historical provider next to last refill . Would you like to refill ?

## 2022-08-12 ENCOUNTER — Other Ambulatory Visit: Payer: Self-pay | Admitting: Cardiovascular Disease

## 2022-08-12 MED ORDER — VALSARTAN-HYDROCHLOROTHIAZIDE 320-12.5 MG PO TABS: 1.0000 | ORAL_TABLET | Freq: Every day | ORAL | 0 refills | Status: DC

## 2022-08-12 NOTE — Telephone Encounter (Signed)
Patient needed script sent to local pharmacy for a 30 day supply of Diovan-HCT.  Sent to [facility] [address].  Patient notified.

## 2022-08-14 ENCOUNTER — Encounter: Payer: Self-pay | Admitting: Family Medicine

## 2022-08-15 ENCOUNTER — Ambulatory Visit (INDEPENDENT_AMBULATORY_CARE_PROVIDER_SITE_OTHER): Payer: 59 | Admitting: Family Medicine

## 2022-08-15 ENCOUNTER — Ambulatory Visit (INDEPENDENT_AMBULATORY_CARE_PROVIDER_SITE_OTHER)
Admission: RE | Admit: 2022-08-15 | Discharge: 2022-08-15 | Disposition: A | Discharge status: Home / Self Care | Payer: 59 | Source: Ambulatory Visit | Attending: Family Medicine | Admitting: Family Medicine

## 2022-08-15 DIAGNOSIS — S199XXA Unspecified injury of neck, initial encounter: Secondary | ICD-10-CM

## 2022-08-15 DIAGNOSIS — R0789 Other chest pain: Secondary | ICD-10-CM | POA: Diagnosis not present

## 2022-08-15 DIAGNOSIS — M25552 Pain in left hip: Secondary | ICD-10-CM | POA: Diagnosis not present

## 2022-08-15 DIAGNOSIS — S8012XA Contusion of left lower leg, initial encounter: Secondary | ICD-10-CM

## 2022-08-15 DIAGNOSIS — S40812A Abrasion of left upper arm, initial encounter: Secondary | ICD-10-CM

## 2022-08-15 MED ORDER — TADALAFIL 20 MG PO TABS: 20.0000 mg | ORAL_TABLET | Freq: Every day | ORAL | 5 refills | Status: DC | PRN

## 2022-08-15 NOTE — Patient Instructions (Signed)
[facility] or xray: Walk in 8:30-4:30 during weekdays, no appointment needed [address]  [place], [place] [address]  Tylenol or advil for now. Ice as needed. Keep abrasion clean, covered as needed. If concerns on xray I will let you know. Sorry this happened. Follow up if not improing in next week to 10 days.  Return to the clinic or go to the nearest emergency room if any of your symptoms worsen or new symptoms occur.
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
## 2022-08-16 ENCOUNTER — Encounter: Payer: Self-pay | Admitting: Family Medicine

## 2022-08-18 ENCOUNTER — Other Ambulatory Visit: Payer: Self-pay | Admitting: Cardiovascular Disease

## 2022-08-18 DIAGNOSIS — E7800 Pure hypercholesterolemia, unspecified: Secondary | ICD-10-CM
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
## 2022-09-03 ENCOUNTER — Encounter: Payer: Self-pay | Admitting: Cardiovascular Disease

## 2022-09-03 ENCOUNTER — Ambulatory Visit: Payer: 59 | Attending: Cardiovascular Disease | Admitting: Cardiovascular Disease

## 2022-09-03 ENCOUNTER — Encounter: Payer: Self-pay | Admitting: Family Medicine

## 2022-09-03 VITALS — BP 128/84 | HR 65 | Ht 74.0 in | Wt 230.0 lb

## 2022-09-03 DIAGNOSIS — I2510 Atherosclerotic heart disease of native coronary artery without angina pectoris: Secondary | ICD-10-CM | POA: Diagnosis not present

## 2022-09-03 DIAGNOSIS — E785 Hyperlipidemia, unspecified: Secondary | ICD-10-CM

## 2022-09-03 DIAGNOSIS — I10 Essential (primary) hypertension: Secondary | ICD-10-CM

## 2022-09-03 NOTE — Patient Instructions (Addendum)
Medication Instructions:  Your physician recommends that you continue on your current medications as directed. Please refer to the Current Medication list given to you today.  *If you need a refill on your cardiac medications before your next appointment, please call your pharmacy*  Lab Work: If you have labs (blood work) drawn today and your tests are completely normal, you will receive your results only by: MyChart Message (if you have MyChart) OR A paper copy in the mail If you have any lab test that is abnormal or we need to change your treatment, we will call you to review the results.  Testing/Procedures: Your physician has requested that you have a stress echocardiogram. For further information please visit [email or web address] Please follow instruction sheet as given.  Follow-Up: At [facility], you and your health needs are our priority.  As part of our continuing mission to provide you with exceptional heart care, we have created designated Provider Care Teams.  These Care Teams include your primary Cardiologist (physician) and Advanced Practice Providers (APPs -  Physician Assistants and Nurse Practitioners) who all work together to provide you with the care you need, when you need it.  We recommend signing up for the patient portal called "MyChart".  Sign up information is provided on this After Visit Summary.  MyChart is used to connect with patients for Virtual Visits (Telemedicine).  Patients are able to view lab/test results, encounter notes, upcoming appointments, etc.  Non-urgent messages can be sent to your provider as well.   To learn more about what you can do with MyChart, go to [email or web address].    Your next appointment:   1 year(s)  Provider:   Charlton Haws, MD

## 2022-09-07 ENCOUNTER — Other Ambulatory Visit: Payer: Self-pay | Admitting: Cardiovascular Disease

## 2022-09-11 ENCOUNTER — Ambulatory Visit (INDEPENDENT_AMBULATORY_CARE_PROVIDER_SITE_OTHER): Payer: 59 | Admitting: Family Medicine

## 2022-09-11 VITALS — BP 128/76 | HR 65 | Temp 98.4°F | Ht 74.0 in | Wt 229.0 lb

## 2022-09-11 DIAGNOSIS — R0789 Other chest pain: Secondary | ICD-10-CM

## 2022-09-11 DIAGNOSIS — M7062 Trochanteric bursitis, left hip: Secondary | ICD-10-CM | POA: Diagnosis not present

## 2022-09-11 DIAGNOSIS — M25512 Pain in left shoulder: Secondary | ICD-10-CM | POA: Diagnosis not present
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
## 2022-09-11 NOTE — Patient Instructions (Signed)
Swelling on the left hip is likely the bursa or fluid-filled sac that may have had some bleeding or inflammation after injury.  We can draw some fluid out of that area, but that can be evaluated when you see sports medicine at orthopedics.  They should be giving you a call for that appointment, and I have requested Dr. Shon Baton to see you for possible ultrasound of your left shoulder and determine if x-ray needed.  Let me know if there are questions in the meantime.

## 2022-09-15 ENCOUNTER — Encounter: Payer: Self-pay | Admitting: Cardiovascular Disease

## 2022-09-15 MED ORDER — VALSARTAN-HYDROCHLOROTHIAZIDE 320-12.5 MG PO TABS: 1.0000 | ORAL_TABLET | Freq: Every day | ORAL | 3 refills | Status: DC

## 2022-09-17 ENCOUNTER — Encounter: Payer: Self-pay | Admitting: Cardiovascular Disease

## 2022-09-19 ENCOUNTER — Ambulatory Visit: Payer: 59 | Admitting: Sports Medicine

## 2022-09-19 ENCOUNTER — Encounter: Payer: Self-pay | Admitting: Sports Medicine

## 2022-09-19 ENCOUNTER — Other Ambulatory Visit (INDEPENDENT_AMBULATORY_CARE_PROVIDER_SITE_OTHER): Payer: 59

## 2022-09-19 DIAGNOSIS — M25512 Pain in left shoulder: Secondary | ICD-10-CM

## 2022-09-19 DIAGNOSIS — S4352XA Sprain of left acromioclavicular joint, initial encounter: Secondary | ICD-10-CM

## 2022-09-19 DIAGNOSIS — S7002XD Contusion of left hip, subsequent encounter: Secondary | ICD-10-CM
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
## 2022-09-22 ENCOUNTER — Encounter: Payer: Self-pay | Admitting: Cardiovascular Disease

## 2022-09-22 ENCOUNTER — Telehealth (HOSPITAL_COMMUNITY): Payer: Self-pay

## 2022-09-22 NOTE — Telephone Encounter (Signed)
Spoke with the patient, detailed instructions given. He stated that he would be here for his test. Asked to call back with any questions. S.Williams EMTP/CCT 

## 2022-09-23 ENCOUNTER — Ambulatory Visit (HOSPITAL_COMMUNITY): Payer: 59

## 2022-10-09 ENCOUNTER — Telehealth (HOSPITAL_COMMUNITY): Payer: Self-pay | Admitting: *Deleted

## 2022-10-09 NOTE — Telephone Encounter (Signed)
Patient given detailed instructions per Stress Test Requisition Sheet for test on 10/13/2022 at 2:30.Patient Notified to arrive 30 minutes early, and that it is imperative to arrive on time for appointment to keep from having the test rescheduled.  Patient verbalized understanding. Daneil Dolin

## 2022-10-13 ENCOUNTER — Ambulatory Visit (HOSPITAL_BASED_OUTPATIENT_CLINIC_OR_DEPARTMENT_OTHER): Payer: 59

## 2022-10-13 ENCOUNTER — Ambulatory Visit (HOSPITAL_COMMUNITY): Payer: 59 | Attending: Cardiology

## 2022-10-13 DIAGNOSIS — I2510 Atherosclerotic heart disease of native coronary artery without angina pectoris: Secondary | ICD-10-CM | POA: Diagnosis present

## 2022-10-13 DIAGNOSIS — E785 Hyperlipidemia, unspecified: Secondary | ICD-10-CM | POA: Diagnosis present

## 2022-10-13 DIAGNOSIS — I10 Essential (primary) hypertension: Secondary | ICD-10-CM

## 2022-10-13 MED ORDER — PERFLUTREN LIPID MICROSPHERE
1.0000 mL | INTRAVENOUS | Status: AC | PRN
Start: 2022-10-13 — End: 2022-10-13
  Administered 2022-10-13 (×3): 2 mL via INTRAVENOUS

## 2022-10-30 ENCOUNTER — Encounter: Payer: Self-pay | Admitting: Dermatology

## 2022-10-30 ENCOUNTER — Ambulatory Visit: Payer: 59 | Admitting: Dermatology

## 2022-10-30 VITALS — BP 126/81 | HR 83

## 2022-10-30 DIAGNOSIS — L814 Other melanin hyperpigmentation: Secondary | ICD-10-CM

## 2022-10-30 DIAGNOSIS — W908XXA Exposure to other nonionizing radiation, initial encounter: Secondary | ICD-10-CM

## 2022-10-30 DIAGNOSIS — D229 Melanocytic nevi, unspecified: Secondary | ICD-10-CM

## 2022-10-30 DIAGNOSIS — L821 Other seborrheic keratosis: Secondary | ICD-10-CM

## 2022-10-30 DIAGNOSIS — L578 Other skin changes due to chronic exposure to nonionizing radiation: Secondary | ICD-10-CM | POA: Diagnosis not present

## 2022-10-30 DIAGNOSIS — Z1283 Encounter for screening for malignant neoplasm of skin: Secondary | ICD-10-CM | POA: Diagnosis not present

## 2022-10-30 DIAGNOSIS — L810 Postinflammatory hyperpigmentation: Secondary | ICD-10-CM

## 2022-10-30 DIAGNOSIS — D1801 Hemangioma of skin and subcutaneous tissue: Secondary | ICD-10-CM

## 2022-10-30 MED ORDER — HYDROQUINONE 4 % EX CREA: TOPICAL_CREAM | Freq: Every evening | CUTANEOUS | 2 refills | Status: DC

## 2022-10-30 NOTE — Patient Instructions (Addendum)

## 2022-10-30 NOTE — Progress Notes (Signed)
   New Patient Visit   Subjective  Cory Mcfarland is a 47 y.o. male who presents for the following:  Total Body Skin Exam (TBSE)  Patient present today for new patient visit for TBSE.The patient denies he  has spots, moles and lesions to be evaluated, some may be new or changing and the patient may have concern these could be cancer. Patient has previously been treated by a dermatologist [phone]). Patient reports he does not have hx of bx. Patient denies family history of skin cancers. Patient reports throughout his lifetime has had  moderate to severe  sun exposure. Currently, patient reports if he has excessive sun exposure, he does apply sunscreen and/or wears protective coverings.  The following portions of the chart were reviewed this encounter and updated as appropriate: medications, allergies, medical history  Review of Systems:  No other skin or systemic complaints except as noted in HPI or Assessment and Plan.  Objective  Well appearing patient in no apparent distress; mood and affect are within normal limits.  A full examination was performed including scalp, head, eyes, ears, nose, lips, neck, chest, axillae, abdomen, back, buttocks, bilateral upper extremities, bilateral lower extremities, hands, feet, fingers, toes, fingernails, and toenails. All findings within normal limits unless otherwise noted below.    Relevant exam findings are noted in the Assessment and Plan.    Assessment & Plan   LENTIGINES, SEBORRHEIC KERATOSES, CHERRYANGIOMAS - Benign normal skin lesions - Benign-appearing - Call for any changes  MELANOCYTIC NEVI - Tan-brown and/or pink-flesh-colored symmetric macules and papules - Benign appearing on exam today - Observation - Call clinic for new or changing moles - Recommend daily use of broad spectrum spf 30+ sunscreen to sun-exposed areas.   MILD ACTINIC DAMAGE - Chronic condition, secondary to cumulative UV/sun exposure - diffuse scaly erythematous  macules with underlying dyspigmentation - Recommend daily broad spectrum sunscreen SPF 30+ to sun-exposed areas, reapply every 2 hours as needed.  - Staying in the shade or wearing long sleeves, sun glasses (UVA+UVB protection) and wide brim hats (4-inch brim around the entire circumference of the hat) are also recommended for sun protection.  - Call for new or changing lesions.  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 Recommend daily sun protection including sunscreen SPF 30+ to sun-exposed areas. - Recommend treating any itchy or red areas on the skin quickly to prevent new areas of PIH. Treating with prescription medicines such as hydroquinone may help fade dark spots faster.    Treatment Plan: - We will prescribe Hydroquinone to apply to the scar at night  SKIN CANCER SCREENING PERFORMED TODAY   No follow-ups on file.   Documentation: I have reviewed the above documentation for accuracy and completeness, and I agree with the above.  Stasia Cavalier, am acting as scribe for Langston Reusing, DO.   Langston Reusing, DO

## 2022-11-14 ENCOUNTER — Other Ambulatory Visit: Payer: Self-pay | Admitting: Cardiovascular Disease

## 2022-11-14 DIAGNOSIS — E7800 Pure hypercholesterolemia, unspecified: Secondary | ICD-10-CM

## 2022-11-19 ENCOUNTER — Ambulatory Visit: Payer: 59 | Admitting: Family Medicine

## 2022-11-19 VITALS — BP 130/82 | HR 58 | Temp 98.3°F | Ht 74.0 in | Wt 232.0 lb

## 2022-11-19 DIAGNOSIS — R051 Acute cough: Secondary | ICD-10-CM | POA: Diagnosis not present

## 2022-11-19 DIAGNOSIS — J329 Chronic sinusitis, unspecified: Secondary | ICD-10-CM

## 2022-11-19 DIAGNOSIS — J40 Bronchitis, not specified as acute or chronic: Secondary | ICD-10-CM | POA: Diagnosis not present

## 2022-11-19 MED ORDER — AMOXICILLIN-POT CLAVULANATE 875-125 MG PO TABS: 1.0000 | ORAL_TABLET | Freq: Two times a day (BID) | ORAL | 0 refills | Status: DC

## 2022-11-19 MED ORDER — BENZONATATE 100 MG PO CAPS: 100.0000 mg | ORAL_CAPSULE | Freq: Three times a day (TID) | ORAL | 0 refills | Status: DC | PRN

## 2022-11-19 MED ORDER — GUAIFENESIN-CODEINE 100-10 MG/5ML PO SOLN: 5.0000 mL | Freq: Every evening | ORAL | 0 refills | Status: DC | PRN

## 2022-11-19 MED ORDER — ALBUTEROL SULFATE HFA 108 (90 BASE) MCG/ACT IN AERS: 1.0000 | INHALATION_SPRAY | Freq: Four times a day (QID) | RESPIRATORY_TRACT | 0 refills | Status: DC | PRN

## 2022-11-19 NOTE — Progress Notes (Signed)
Subjective:  Patient ID: Cory Mcfarland, male    DOB: 03-04-1975  Age: 47 y.o. MRN: [id number]  CC:  Chief Complaint  Patient presents with   Cough    Cough, sinus pressure, sinus drainage, started Thursday last week, wheezing/loose feeling trouble sleeping due to cough /congestion Covid test earlier in the week was negative but does note it was an older test didn't know if he should be re tested     HPI Cory Mcfarland presents for   Cough: As above, started about 7 days ago.   Family had similar symptoms prior - nasal congestion, cough, sore throat.  Negative home COVID testing day after start of symptoms. His symptoms of cough, noise with exhaling, triggers cough and wheeze. Sinus congestion yellow nasal congestion past 2 days, but minimal. Sinus HA today.  No fever.  No hx of asthma. No dyspnea. Able to ride low impact ride ride on peloton. Used albuterol in past with infection. No regular use.  Tx: dayquil nyquil, saline ns.   Cough is worst part - some insomnia.    History Patient Active Problem List   Diagnosis Date Noted   Essential hypertension 03/14/2020   High cholesterol 12/24/2016   Past Medical History:  Diagnosis Date   Anemia    Ankle sprain    Hyperlipidemia    Hypertension    Past Surgical History:  Procedure Laterality Date   COLONOSCOPY     hemorroid removal     8 years   No Known Allergies Prior to Admission medications   Medication Sig Start Date End Date Taking? Authorizing Provider  atorvastatin (LIPITOR) 40 MG tablet TAKE 1 TABLET DAILY (KEEP APPOINTMENT FOR FUTURE REFILLS, 1ST ATTEMPT) 11/14/22  Yes Wendall Stade, MD  hydroquinone 4 % cream Apply topically at bedtime. 10/30/22  Yes Terri Piedra, DO  Multiple Vitamin (MULTIVITAMIN) tablet Take 1 tablet by mouth daily.   Yes [provider]  tadalafil (CIALIS) 20 MG tablet Take 1 tablet (20 mg total) by mouth daily as needed for erectile dysfunction. 08/15/22  Yes Shade Flood,  MD  valsartan-hydrochlorothiazide (DIOVAN-HCT) 320-12.5 MG tablet Take 1 tablet by mouth daily. 09/15/22  Yes Wendall Stade, MD   Social History   Socioeconomic History   Marital status: Married    Spouse name: Not on file   Number of children: Not on file   Years of education: Not on file   Highest education level: Master's degree (e.g., MA, MS, MEng, MEd, MSW, MBA)  Occupational History   Not on file  Tobacco Use   Smoking status: Former    Passive exposure: Never   Smokeless tobacco: Never   Tobacco comments:    Quit 2003 ~  Vaping Use   Vaping status: Never Used  Substance and Sexual Activity   Alcohol use: Yes    Alcohol/week: 8.0 standard drinks of alcohol    Types: 8 Glasses of wine per week   Drug use: Not Currently    Types: Marijuana    Comment: on occasion with friends in legal states   Sexual activity: Yes  Other Topics Concern   Not on file  Social History Narrative   Not on file   Social Determinants of Health   Financial Resource Strain: Low Risk  (11/19/2022)   Overall Financial Resource Strain (CARDIA)    Difficulty of Paying Living Expenses: Not hard at all  Food Insecurity: No Food Insecurity (11/19/2022)   Hunger Vital Sign  Worried About Programme researcher, broadcasting/film/video in the Last Year: Never true    Ran Out of Food in the Last Year: Never true  Transportation Needs: No Transportation Needs (11/19/2022)   PRAPARE - Administrator, Civil Service (Medical): No    Lack of Transportation (Non-Medical): No  Physical Activity: Sufficiently Active (11/19/2022)   Exercise Vital Sign    Days of Exercise per Week: 6 days    Minutes of Exercise per Session: 30 min  Stress: No Stress Concern Present (11/19/2022)   [employer] of Occupational Health - Occupational Stress Questionnaire    Feeling of Stress : Only a little  Recent Concern: Stress - Stress Concern Present (09/11/2022)   [employer] of Occupational Health - Occupational Stress  Questionnaire    Feeling of Stress : To some extent  Social Connections: Socially Integrated (11/19/2022)   Social Connection and Isolation Panel [NHANES]    Frequency of Communication with Friends and Family: Twice a week    Frequency of Social Gatherings with Friends and Family: Once a week    Attends Religious Services: More than 4 times per year    Active Member of [employer] or Organizations: Yes    Attends Banker Meetings: 1 to 4 times per year    Marital Status: Married  Catering manager Violence: Not on file    Review of Systems   Objective:   Vitals:   11/19/22 1401  BP: 130/82  Pulse: (!) 58  Temp: 98.3 F (36.8 C)  TempSrc: Temporal  SpO2: 98%  Weight: 232 lb (105.2 kg)  Height: 6\' 2"  (1.88 m)     Physical Exam Vitals reviewed.  Constitutional:      Appearance: He is well-developed.  HENT:     Head: Normocephalic and atraumatic.     Right Ear: Tympanic membrane, ear canal and external ear normal.     Left Ear: Tympanic membrane, ear canal and external ear normal.     Nose: No rhinorrhea.     Comments: Slight left frontal sinus tenderness, maxillary sinus nontender.    Mouth/Throat:     Pharynx: No oropharyngeal exudate or posterior oropharyngeal erythema.  Eyes:     Conjunctiva/sclera: Conjunctivae normal.     Pupils: Pupils are equal, round, and reactive to light.  Cardiovascular:     Rate and Rhythm: Normal rate and regular rhythm.     Heart sounds: Normal heart sounds. No murmur heard. Pulmonary:     Effort: Pulmonary effort is normal.     Breath sounds: Rhonchi (Faint coarse breath sounds at left base, overall clear and good airflow, no respiratory distress, no audible wheeze.) present. No wheezing or rales.  Abdominal:     Palpations: Abdomen is soft.     Tenderness: There is no abdominal tenderness.  Musculoskeletal:     Cervical back: Neck supple.  Lymphadenopathy:     Cervical: No cervical adenopathy.  Skin:    General: Skin is  warm and dry.     Findings: No rash.  Neurological:     Mental Status: He is alert and oriented to person, place, and time.  Psychiatric:        Behavior: Behavior normal.        Assessment & Plan:  Cory Mcfarland is a 47 y.o. male . Acute cough - Plan: amoxicillin-clavulanate (AUGMENTIN) 875-125 MG tablet, benzonatate (TESSALON) 100 MG capsule, guaiFENesin-codeine 100-10 MG/5ML syrup, albuterol (VENTOLIN HFA) 108 (90 Base) MCG/ACT inhaler  Sinobronchitis - Plan: amoxicillin-clavulanate (  AUGMENTIN) 875-125 MG tablet, benzonatate (TESSALON) 100 MG capsule, guaiFENesin-codeine 100-10 MG/5ML syrup, albuterol (VENTOLIN HFA) 108 (90 Base) MCG/ACT inhaler  Probable initial viral illness, now going into seventh day of symptoms with some increased cough, intermittent wheeze.  Left frontal sinus pain and discolored nasal discharge along with few coarse breath sounds left lung base.  Sinobronchitis versus early lower respiratory tract infection/pneumonia.  Start Augmentin, potential side effects discussed.  Tessalon Perles for cough during the day, codeine cough syrup as needed at night, albuterol if needed for wheeze with RTC precautions if persistent or frequent use.  ER precautions given.  Meds ordered this encounter  Medications   amoxicillin-clavulanate (AUGMENTIN) 875-125 MG tablet    Sig: Take 1 tablet by mouth 2 (two) times daily.    Dispense:  20 tablet    Refill:  0   benzonatate (TESSALON) 100 MG capsule    Sig: Take 1 capsule (100 mg total) by mouth 3 (three) times daily as needed for cough.    Dispense:  20 capsule    Refill:  0   guaiFENesin-codeine 100-10 MG/5ML syrup    Sig: Take 5-10 mLs by mouth at bedtime as needed for cough.    Dispense:  120 mL    Refill:  0   albuterol (VENTOLIN HFA) 108 (90 Base) MCG/ACT inhaler    Sig: Inhale 1-2 puffs into the lungs every 6 (six) hours as needed for wheezing or shortness of breath.    Dispense:  8 g    Refill:  0   Patient  Instructions  Lung exam is overall reassuring today but there may be a slight amount of congestion on the left side.  Overall exam is reassuring.  I will start antibiotic for possible early sinus infection or chest infection such as bronchitis, but for cough can try Tessalon Perles during the day, codeine cough syrup at night, albuterol if needed for wheezing but if that is persistent  (or you require albuterol more than a few times) be seen.  I hope you feel better soon.   Return to the clinic or go to the nearest emergency room if any of your symptoms worsen or new symptoms occur.  Cough, Adult Coughing is a reflex that clears your throat and airways (respiratory system). It helps heal and protect your lungs. It is normal to cough from time to time. A cough that happens with other symptoms or that lasts a long time may be a sign of a condition that needs treatment. A short-term (acute) cough may only last 2-3 weeks. A long-term (chronic) cough may last 8 or more weeks. Coughing is often caused by: Diseases, such as: An infection of the respiratory system. Asthma or other heart or lung diseases. Gastroesophageal reflux. This is when acid comes back up from the stomach. Breathing in things that irritate your lungs. Allergies. Postnasal drip. This is when mucus runs down the back of your throat. Smoking. Some medicines. Follow these instructions at home: Medicines Take over-the-counter and prescription medicines only as told by your health care provider. Talk with your provider before you take cough medicine (cough suppressants). Eating and drinking Do not drink alcohol. Avoid caffeine. Drink enough fluid to keep your pee (urine) pale yellow. Lifestyle Avoid cigarette smoke. Do not use any products that contain nicotine or tobacco. These products include cigarettes, chewing tobacco, and vaping devices, such as e-cigarettes. If you need help quitting, ask your provider. Avoid things that  make you cough. These may include perfumes,  candles, cleaning products, or campfire smoke. General instructions  Watch for any changes to your cough. Tell your provider about them. Always cover your mouth when you cough. If the air is dry in your bedroom or home, use a cool mist vaporizer or humidifier. If your cough is worse at night, try to sleep in a semi-upright position. Rest as needed. Contact a health care provider if: You have new symptoms, or your symptoms get worse. You cough up pus. You have a fever that does not go away or a cough that does not get better after 2-3 weeks. You cannot control your cough with medicine, and you are losing sleep. You have pain that gets worse or is not helped with medicine. You lose weight for no clear reason. You have night sweats. Get help right away if: You cough up blood. You have trouble breathing. Your heart is beating very fast. These symptoms may be an emergency. Get help right away. Call 911. Do not wait to see if the symptoms will go away. Do not drive yourself to the hospital. This information is not intended to replace advice given to you by your health care provider. Make sure you discuss any questions you have with your health care provider. Document Revised: 09/13/2021 Document Reviewed: 09/13/2021 Elsevier Patient Education  2024 Elsevier Inc.       Signed,   Meredith Staggers, MD [facility], [facility] 11/19/22 2:35 PM

## 2022-11-19 NOTE — Patient Instructions (Signed)
Lung exam is overall reassuring today but there may be a slight amount of congestion on the left side.  Overall exam is reassuring.  I will start antibiotic for possible early sinus infection or chest infection such as bronchitis, but for cough can try Tessalon Perles during the day, codeine cough syrup at night, albuterol if needed for wheezing but if that is persistent  (or you require albuterol more than a few times) be seen.  I hope you feel better soon.   Return to the clinic or go to the nearest emergency room if any of your symptoms worsen or new symptoms occur.  Cough, Adult Coughing is a reflex that clears your throat and airways (respiratory system). It helps heal and protect your lungs. It is normal to cough from time to time. A cough that happens with other symptoms or that lasts a long time may be a sign of a condition that needs treatment. A short-term (acute) cough may only last 2-3 weeks. A long-term (chronic) cough may last 8 or more weeks. Coughing is often caused by: Diseases, such as: An infection of the respiratory system. Asthma or other heart or lung diseases. Gastroesophageal reflux. This is when acid comes back up from the stomach. Breathing in things that irritate your lungs. Allergies. Postnasal drip. This is when mucus runs down the back of your throat. Smoking. Some medicines. Follow these instructions at home: Medicines Take over-the-counter and prescription medicines only as told by your health care provider. Talk with your provider before you take cough medicine (cough suppressants). Eating and drinking Do not drink alcohol. Avoid caffeine. Drink enough fluid to keep your pee (urine) pale yellow. Lifestyle Avoid cigarette smoke. Do not use any products that contain nicotine or tobacco. These products include cigarettes, chewing tobacco, and vaping devices, such as e-cigarettes. If you need help quitting, ask your provider. Avoid things that make you cough.  These may include perfumes, candles, cleaning products, or campfire smoke. General instructions  Watch for any changes to your cough. Tell your provider about them. Always cover your mouth when you cough. If the air is dry in your bedroom or home, use a cool mist vaporizer or humidifier. If your cough is worse at night, try to sleep in a semi-upright position. Rest as needed. Contact a health care provider if: You have new symptoms, or your symptoms get worse. You cough up pus. You have a fever that does not go away or a cough that does not get better after 2-3 weeks. You cannot control your cough with medicine, and you are losing sleep. You have pain that gets worse or is not helped with medicine. You lose weight for no clear reason. You have night sweats. Get help right away if: You cough up blood. You have trouble breathing. Your heart is beating very fast. These symptoms may be an emergency. Get help right away. Call 911. Do not wait to see if the symptoms will go away. Do not drive yourself to the hospital. This information is not intended to replace advice given to you by your health care provider. Make sure you discuss any questions you have with your health care provider. Document Revised: 09/13/2021 Document Reviewed: 09/13/2021 Elsevier Patient Education  2024 [employer].

## 2022-12-07 ENCOUNTER — Other Ambulatory Visit: Payer: Self-pay | Admitting: Family Medicine

## 2022-12-07 DIAGNOSIS — J329 Chronic sinusitis, unspecified: Secondary | ICD-10-CM

## 2022-12-07 DIAGNOSIS — R051 Acute cough: Secondary | ICD-10-CM

## 2022-12-22 ENCOUNTER — Encounter: Payer: Self-pay | Admitting: Dermatology

## 2022-12-22 NOTE — Telephone Encounter (Signed)
Can we see if there's any availability in early December?  He can be a double book. Thanks.

## 2022-12-23 NOTE — Telephone Encounter (Signed)
Ok.  Thanks for calling

## 2023-02-13 ENCOUNTER — Encounter: Payer: Self-pay | Admitting: Family Medicine

## 2023-02-13 ENCOUNTER — Ambulatory Visit (INDEPENDENT_AMBULATORY_CARE_PROVIDER_SITE_OTHER): Payer: 59 | Admitting: Family Medicine

## 2023-02-13 VITALS — BP 118/70 | HR 54 | Temp 98.2°F | Ht 74.25 in | Wt 227.6 lb

## 2023-02-13 DIAGNOSIS — Z1329 Encounter for screening for other suspected endocrine disorder: Secondary | ICD-10-CM

## 2023-02-13 DIAGNOSIS — Z125 Encounter for screening for malignant neoplasm of prostate: Secondary | ICD-10-CM | POA: Diagnosis not present

## 2023-02-13 DIAGNOSIS — Z0000 Encounter for general adult medical examination without abnormal findings: Secondary | ICD-10-CM

## 2023-02-13 DIAGNOSIS — I10 Essential (primary) hypertension: Secondary | ICD-10-CM

## 2023-02-13 DIAGNOSIS — Z130 Encounter for screening for diseases of the blood and blood-forming organs and certain disorders involving the immune mechanism: Secondary | ICD-10-CM | POA: Diagnosis not present

## 2023-02-13 DIAGNOSIS — R7303 Prediabetes: Secondary | ICD-10-CM | POA: Diagnosis not present

## 2023-02-13 DIAGNOSIS — Z23 Encounter for immunization: Secondary | ICD-10-CM | POA: Diagnosis not present

## 2023-02-13 DIAGNOSIS — E785 Hyperlipidemia, unspecified: Secondary | ICD-10-CM | POA: Diagnosis not present

## 2023-02-13 DIAGNOSIS — L299 Pruritus, unspecified: Secondary | ICD-10-CM

## 2023-02-13 LAB — CBC WITH DIFFERENTIAL/PLATELET
Basophils Absolute: 0 10*3/uL (ref 0.0–0.1)
Basophils Relative: 0.7 % (ref 0.0–3.0)
Eosinophils Absolute: 0.2 10*3/uL (ref 0.0–0.7)
Eosinophils Relative: 5.1 % — ABNORMAL HIGH (ref 0.0–5.0)
HCT: 42.6 % (ref 39.0–52.0)
Hemoglobin: 14 g/dL (ref 13.0–17.0)
Lymphocytes Relative: 37.5 % (ref 12.0–46.0)
Lymphs Abs: 1.4 10*3/uL (ref 0.7–4.0)
MCHC: 32.8 g/dL (ref 30.0–36.0)
MCV: 80 fL (ref 78.0–100.0)
Monocytes Absolute: 0.4 10*3/uL (ref 0.1–1.0)
Monocytes Relative: 10.4 % (ref 3.0–12.0)
Neutro Abs: 1.7 10*3/uL (ref 1.4–7.7)
Neutrophils Relative %: 46.3 % (ref 43.0–77.0)
Platelets: 230 10*3/uL (ref 150.0–400.0)
RBC: 5.33 Mil/uL (ref 4.22–5.81)
RDW: 14.4 % (ref 11.5–15.5)
WBC: 3.8 10*3/uL — ABNORMAL LOW (ref 4.0–10.5)

## 2023-02-13 LAB — LIPID PANEL
Cholesterol: 162 mg/dL (ref 0–200)
HDL: 42.4 mg/dL (ref >39.00)
LDL Cholesterol: 89 mg/dL (ref 0–99)
NonHDL: 119.47
Total CHOL/HDL Ratio: 4
Triglycerides: 150 mg/dL — ABNORMAL HIGH (ref 0.0–149.0)
VLDL: 30 mg/dL (ref 0.0–40.0)

## 2023-02-13 LAB — POCT URINALYSIS DIP (MANUAL ENTRY)
Bilirubin, UA: NEGATIVE
Blood, UA: NEGATIVE
Glucose, UA: NEGATIVE mg/dL
Ketones, POC UA: NEGATIVE mg/dL
Leukocytes, UA: NEGATIVE
Nitrite, UA: NEGATIVE
Protein Ur, POC: NEGATIVE mg/dL
Spec Grav, UA: 1.025 (ref 1.010–1.025)
Urobilinogen, UA: 0.2 U/dL
pH, UA: 6 (ref 5.0–8.0)

## 2023-02-13 LAB — COMPREHENSIVE METABOLIC PANEL
ALT: 33 U/L (ref 0–53)
AST: 27 U/L (ref 0–37)
Albumin: 4.7 g/dL (ref 3.5–5.2)
Alkaline Phosphatase: 46 U/L (ref 39–117)
BUN: 19 mg/dL (ref 6–23)
CO2: 27 meq/L (ref 19–32)
Calcium: 9.5 mg/dL (ref 8.4–10.5)
Chloride: 105 meq/L (ref 96–112)
Creatinine, Ser: 0.98 mg/dL (ref 0.40–1.50)
GFR: 92.06 mL/min (ref >60.00)
Glucose, Bld: 98 mg/dL (ref 70–99)
Potassium: 4.4 meq/L (ref 3.5–5.1)
Sodium: 140 meq/L (ref 135–145)
Total Bilirubin: 0.6 mg/dL (ref 0.2–1.2)
Total Protein: 7.3 g/dL (ref 6.0–8.3)

## 2023-02-13 LAB — TSH: TSH: 1.1 u[IU]/mL (ref 0.35–5.50)

## 2023-02-13 LAB — PSA: PSA: 0.48 ng/mL (ref 0.10–4.00)

## 2023-02-13 LAB — HEMOGLOBIN A1C: Hgb A1c MFr Bld: 6 % (ref 4.6–6.5)

## 2023-02-13 NOTE — Patient Instructions (Addendum)
Triamcinolone cream is fine if needed for itching but try using Eucerin or Aveeno lotion after bathing, and then again 2-3 times per day.  If that does not help in the next week or 2, I do recommend follow-up with dermatology.  I will check some screening labs today and let you know if any concerns.  No medication changes at this time. Take care!  Preventive Care 56-48 Years Old, Male Preventive care refers to lifestyle choices and visits with your health care provider that can promote health and wellness. Preventive care visits are also called wellness exams. What can I expect for my preventive care visit? Counseling During your preventive care visit, your health care provider may ask about your: Medical history, including: Past medical problems. Family medical history. Current health, including: Emotional well-being. Home life and relationship well-being. Sexual activity. Lifestyle, including: Alcohol, nicotine or tobacco, and drug use. Access to firearms. Diet, exercise, and sleep habits. Safety issues such as seatbelt and bike helmet use. Sunscreen use. Work and work Astronomer. Physical exam Your health care provider will check your: Height and weight. These may be used to calculate your BMI (body mass index). BMI is a measurement that tells if you are at a healthy weight. Waist circumference. This measures the distance around your waistline. This measurement also tells if you are at a healthy weight and may help predict your risk of certain diseases, such as type 2 diabetes and high blood pressure. Heart rate and blood pressure. Body temperature. Skin for abnormal spots. What immunizations do I need?  Vaccines are usually given at various ages, according to a schedule. Your health care provider will recommend vaccines for you based on your age, medical history, and lifestyle or other factors, such as travel or where you work. What tests do I need? Screening Your health care  provider may recommend screening tests for certain conditions. This may include: Lipid and cholesterol levels. Diabetes screening. This is done by checking your blood sugar (glucose) after you have not eaten for a while (fasting). Hepatitis B test. Hepatitis C test. HIV (human immunodeficiency virus) test. STI (sexually transmitted infection) testing, if you are at risk. Lung cancer screening. Prostate cancer screening. Colorectal cancer screening. Talk with your health care provider about your test results, treatment options, and if necessary, the need for more tests. Follow these instructions at home: Eating and drinking  Eat a diet that includes fresh fruits and vegetables, whole grains, lean protein, and low-fat dairy products. Take vitamin and mineral supplements as recommended by your health care provider. Do not drink alcohol if your health care provider tells you not to drink. If you drink alcohol: Limit how much you have to 0-2 drinks a day. Know how much alcohol is in your drink. In the [place] one drink equals one 12 oz bottle of beer (355 mL), one 5 oz glass of wine (148 mL), or one 1 oz glass of hard liquor (44 mL). Lifestyle Brush your teeth every morning and night with fluoride toothpaste. Floss one time each day. Exercise for at least 30 minutes 5 or more days each week. Do not use any products that contain nicotine or tobacco. These products include cigarettes, chewing tobacco, and vaping devices, such as e-cigarettes. If you need help quitting, ask your health care provider. Do not use drugs. If you are sexually active, practice safe sex. Use a condom or other form of protection to prevent STIs. Take aspirin only as told by your health care provider. Make  sure that you understand how much to take and what form to take. Work with your health care provider to find out whether it is safe and beneficial for you to take aspirin daily. Find healthy ways to manage stress, such  as: Meditation, yoga, or listening to music. Journaling. Talking to a trusted person. Spending time with friends and family. Minimize exposure to UV radiation to reduce your risk of skin cancer. Safety Always wear your seat belt while driving or riding in a vehicle. Do not drive: If you have been drinking alcohol. Do not ride with someone who has been drinking. When you are tired or distracted. While texting. If you have been using any mind-altering substances or drugs. Wear a helmet and other protective equipment during sports activities. If you have firearms in your house, make sure you follow all gun safety procedures. What's next? Go to your health care provider once a year for an annual wellness visit. Ask your health care provider how often you should have your eyes and teeth checked. Stay up to date on all vaccines. This information is not intended to replace advice given to you by your health care provider. Make sure you discuss any questions you have with your health care provider. Document Revised: 07/11/2020 Document Reviewed: 07/11/2020 Elsevier Patient Education  2024 [employer].
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
## 2023-02-18 ENCOUNTER — Encounter: Payer: Self-pay | Admitting: Family Medicine

## 2023-02-23 ENCOUNTER — Telehealth: Payer: Self-pay

## 2023-02-23 NOTE — Telephone Encounter (Signed)
Copied from CRM [id number]. Topic: General - Other >> Feb 23, 2023 12:01 PM Truddie Crumble wrote: Reason for CRM: patient returning a call to the office for [facility]

## 2023-02-23 NOTE — Telephone Encounter (Signed)
Pt has been notified of lab results

## 2023-02-23 NOTE — Telephone Encounter (Signed)
Electrolytes, kidney, liver test, cholesterol levels, thyroid test were normal.  Prediabetes test was stable.  Let me know if there are questions

## 2023-06-02 ENCOUNTER — Encounter: Payer: Self-pay | Admitting: Dermatology

## 2023-06-02 ENCOUNTER — Ambulatory Visit (INDEPENDENT_AMBULATORY_CARE_PROVIDER_SITE_OTHER): Admitting: Dermatology

## 2023-06-02 VITALS — BP 139/90

## 2023-06-02 DIAGNOSIS — R202 Paresthesia of skin: Secondary | ICD-10-CM

## 2023-06-02 MED ORDER — SAFETY SEAL MISCELLANEOUS MISC: 5 refills | Status: AC

## 2023-06-02 NOTE — Patient Instructions (Addendum)
 Hello Cory Mcfarland,  Thank you for visiting today. Here is a summary of the key instructions:  - Medications:   - Stop using triamcinolone  cream   - Use neurorelief cream twice a day for six weeks   - Use CeraVe itch relief cream with pramoxine while waiting for neurorelief cream  - Pharmacy:   [facility] pharmacy will call to confirm your address   - [facility] will ship the neurorelief cream within 4 days   - Neurorelief cream costs $45  - Follow-up:   - Follow up in 6 weeks and if not improvement we will perform a skin biopsy of the area   -Cancel follow-up appointment if the neurorelief cream works   - Contact the office if the itch returns after stopping the cream  Please reach out if you have any questions or concerns.  Warm regards,  Dr. Louana Roup, Dermatology Important Information  Due to recent changes in healthcare laws, you may see results of your pathology and/or laboratory studies on MyChart before the doctors have had a chance to review them. We understand that in some cases there may be results that are confusing or concerning to you. Please understand that not all results are received at the same time and often the doctors may need to interpret multiple results in order to provide you with the best plan of care or course of treatment. Therefore, we ask that you please give us  2 business days to thoroughly review all your results before contacting the office for clarification. Should we see a critical lab result, you will be contacted sooner.   If You Need Anything After Your Visit  If you have any questions or concerns for your doctor, please call our main line at [phone] If no one answers, please leave a voicemail as directed and we will return your call as soon as possible. Messages left after 4 pm will be answered the following business day.   You may also send us  a message via MyChart. We typically respond to MyChart messages within 1-2 business days.  For  prescription refills, please ask your pharmacy to contact our office. Our fax number is [phone].  If you have an urgent issue when the clinic is closed that cannot wait until the next business day, you can page your doctor at the number below.    Please note that while we do our best to be available for urgent issues outside of office hours, we are not available 24/7.   If you have an urgent issue and are unable to reach us , you may choose to seek medical care at your doctor's office, retail clinic, urgent care center, or emergency room.  If you have a medical emergency, please immediately call 911 or go to the emergency department. In the event of inclement weather, please call our main line at [phone] for an update on the status of any delays or closures.  Dermatology Medication Tips: Please keep the boxes that topical medications come in in order to help keep track of the instructions about where and how to use these. Pharmacies typically print the medication instructions only on the boxes and not directly on the medication tubes.   If your medication is too expensive, please contact our office at [phone] or send us  a message through MyChart.   We are unable to tell what your co-pay for medications will be in advance as this is different depending on your insurance coverage. However, we may be able to find  a substitute medication at lower cost or fill out paperwork to get insurance to cover a needed medication.   If a prior authorization is required to get your medication covered by your insurance company, please allow us  1-2 business days to complete this process.  Drug prices often vary depending on where the prescription is filled and some pharmacies may offer cheaper prices.  The website www.goodrx.com contains coupons for medications through different pharmacies. The prices here do not account for what the cost may be with help from insurance (it may be cheaper with your  insurance), but the website can give you the price if you did not use any insurance.  - You can print the associated coupon and take it with your prescription to the pharmacy.  - You may also stop by our office during regular business hours and pick up a GoodRx coupon card.  - If you need your prescription sent electronically to a different pharmacy, notify our office through [facility] or by phone at [phone]
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
## 2023-07-20 ENCOUNTER — Ambulatory Visit: Admitting: Dermatology

## 2023-08-14 ENCOUNTER — Encounter: Payer: Self-pay | Admitting: Family Medicine

## 2023-08-14 ENCOUNTER — Ambulatory Visit: Payer: 59 | Admitting: Family Medicine

## 2023-08-14 VITALS — BP 118/80 | HR 60 | Temp 98.0°F | Wt 229.6 lb

## 2023-08-14 DIAGNOSIS — R7303 Prediabetes: Secondary | ICD-10-CM

## 2023-08-14 DIAGNOSIS — I10 Essential (primary) hypertension: Secondary | ICD-10-CM | POA: Diagnosis not present

## 2023-08-14 DIAGNOSIS — E7800 Pure hypercholesterolemia, unspecified: Secondary | ICD-10-CM | POA: Diagnosis not present

## 2023-08-14 DIAGNOSIS — R7989 Other specified abnormal findings of blood chemistry: Secondary | ICD-10-CM

## 2023-08-14 DIAGNOSIS — N529 Male erectile dysfunction, unspecified: Secondary | ICD-10-CM

## 2023-08-14 LAB — CBC
HCT: 41.5 % (ref 39.0–52.0)
Hemoglobin: 13.6 g/dL (ref 13.0–17.0)
MCHC: 32.7 g/dL (ref 30.0–36.0)
MCV: 79.3 fl (ref 78.0–100.0)
Platelets: 230 K/uL (ref 150.0–400.0)
RBC: 5.23 Mil/uL (ref 4.22–5.81)
RDW: 14.9 % (ref 11.5–15.5)
WBC: 3.2 K/uL — ABNORMAL LOW (ref 4.0–10.5)

## 2023-08-14 LAB — LIPID PANEL
Cholesterol: 171 mg/dL (ref 0–200)
HDL: 47.9 mg/dL (ref >39.00)
LDL Cholesterol: 83 mg/dL (ref 0–99)
NonHDL: 123.03
Total CHOL/HDL Ratio: 4
Triglycerides: 202 mg/dL — ABNORMAL HIGH (ref 0.0–149.0)
VLDL: 40.4 mg/dL — ABNORMAL HIGH (ref 0.0–40.0)

## 2023-08-14 LAB — COMPREHENSIVE METABOLIC PANEL WITH GFR
ALT: 42 U/L (ref 0–53)
AST: 30 U/L (ref 0–37)
Albumin: 4.8 g/dL (ref 3.5–5.2)
Alkaline Phosphatase: 45 U/L (ref 39–117)
BUN: 19 mg/dL (ref 6–23)
CO2: 26 meq/L (ref 19–32)
Calcium: 9.8 mg/dL (ref 8.4–10.5)
Chloride: 103 meq/L (ref 96–112)
Creatinine, Ser: 0.96 mg/dL (ref 0.40–1.50)
GFR: 94.04 mL/min (ref >60.00)
Glucose, Bld: 90 mg/dL (ref 70–99)
Potassium: 4.3 meq/L (ref 3.5–5.1)
Sodium: 140 meq/L (ref 135–145)
Total Bilirubin: 0.6 mg/dL (ref 0.2–1.2)
Total Protein: 7.3 g/dL (ref 6.0–8.3)

## 2023-08-14 LAB — HEMOGLOBIN A1C: Hgb A1c MFr Bld: 6.1 % (ref 4.6–6.5)

## 2023-08-14 MED ORDER — TADALAFIL 20 MG PO TABS: 20.0000 mg | ORAL_TABLET | Freq: Every day | ORAL | 5 refills | Status: DC | PRN

## 2023-08-14 MED ORDER — ATORVASTATIN CALCIUM 40 MG PO TABS
ORAL_TABLET | ORAL | 3 refills | Status: AC
Start: 2023-08-14 — End: ?

## 2023-08-14 MED ORDER — VALSARTAN-HYDROCHLOROTHIAZIDE 320-12.5 MG PO TABS: 1.0000 | ORAL_TABLET | Freq: Every day | ORAL | 3 refills | Status: AC

## 2023-08-14 NOTE — Progress Notes (Signed)
 Subjective:  Patient ID: Cory Mcfarland, male    DOB: [birth year]  Age: 48 y.o. MRN: [id number]  CC:  Chief Complaint  Patient presents with   Hyperlipidemia   Hypertension    HPI Cory Mcfarland presents for   Hypertension: Diovan  HCT 320/12.5 mg daily without any new side effects. Home readings: no recent readings. Usually 130's systolic. Goals discussed.  BP Readings from Last 3 Encounters:  08/14/23 118/80  06/02/23 (!) 139/90  02/13/23 118/70   Lab Results  Component Value Date   CREATININE 0.98 02/13/2023   Hyperlipidemia: Lipitor 40 mg daily without new myalgias. Lab Results  Component Value Date   CHOL 162 02/13/2023   HDL 42.40 02/13/2023   LDLCALC 89 02/13/2023   TRIG 150.0 (H) 02/13/2023   CHOLHDL 4 02/13/2023   Lab Results  Component Value Date   ALT 33 02/13/2023   AST 27 02/13/2023   ALKPHOS 46 02/13/2023   BILITOT 0.6 02/13/2023   Prediabetes: Minimal change in weight from last visit, regular exercise, no new meds. Lab Results  Component Value Date   HGBA1C 6.0 02/13/2023   Wt Readings from Last 3 Encounters:  08/14/23 229 lb 9.6 oz (104.1 kg)  02/13/23 227 lb 9.6 oz (103.2 kg)  11/19/22 232 lb (105.2 kg)   Notalgia paresthetica He was seen by dermatology, Dr. Alm, diagnosed with notalgia paresthetica - chronic localized pruritus.  Given lack of visible inflammation and ineffectiveness of topical steroids, triamcinolone  was discontinued, prescribed neuro relief cream which is compounded with lidocaine gabapentin and amitriptyline.  6 weeks topical treatment.  CeraVie itch relief with prolonged seen for interim relief.  If persistent symptoms after 6 weeks consider skin biopsy.  Plans follow up - cream is not working. More itching last few weeks, then subsided. Appt with dermatology in October - on cancellation list.    Borderline neutropenia WBC borderline at 3.8 on 01/2023 labs.  Similar level noted in the past. No fevers, weight loss, night  sweats.        History Patient Active Problem List   Diagnosis Date Noted   Essential hypertension 03/14/2020   High cholesterol 12/24/2016   Past Medical History:  Diagnosis Date   Anemia    Ankle sprain    Hyperlipidemia    Hypertension    Past Surgical History:  Procedure Laterality Date   COLONOSCOPY     hemorroid removal     8 years   No Known Allergies Prior to Admission medications   Medication Sig Start Date End Date Taking? Authorizing Provider  atorvastatin  (LIPITOR) 40 MG tablet TAKE 1 TABLET DAILY (KEEP APPOINTMENT FOR FUTURE REFILLS, 1ST ATTEMPT) 11/14/22  Yes Nishan, Peter C, MD  hydroquinone  4 % cream Apply topically at bedtime. 10/30/22  Yes Alm Delon SAILOR, DO  Multiple Vitamin (MULTIVITAMIN) tablet Take 1 tablet by mouth daily.   Yes [provider]  Safety Seal Miscellaneous MISC Post Neuralgia cream gabapentin USP 4% and Lidocaine USP 2% - use on affected area of left side twice a day for 6 weeks then stop. 06/02/23  Yes Alm Delon SAILOR, DO  tadalafil  (CIALIS ) 20 MG tablet Take 1 tablet (20 mg total) by mouth daily as needed for erectile dysfunction. 08/15/22  Yes Levora Reyes SAUNDERS, MD  valsartan -hydrochlorothiazide  (DIOVAN -HCT) 320-12.5 MG tablet Take 1 tablet by mouth daily. 09/15/22  Yes Delford Maude BROCKS, MD   Social History   Socioeconomic History   Marital status: Married    Spouse name: Not on file  Number of children: Not on file   Years of education: Not on file   Highest education level: Master's degree (e.g., MA, MS, MEng, MEd, MSW, MBA)  Occupational History   Not on file  Tobacco Use   Smoking status: Former    Passive exposure: Never   Smokeless tobacco: Never   Tobacco comments:    Quit 2003 ~  Vaping Use   Vaping status: Never Used  Substance and Sexual Activity   Alcohol use: Yes    Alcohol/week: 8.0 standard drinks of alcohol    Types: 8 Glasses of wine per week   Drug use: Not Currently    Types: Marijuana     Comment: on occasion with friends in legal states   Sexual activity: Yes  Other Topics Concern   Not on file  Social History Narrative   Not on file   Social Drivers of Health   Financial Resource Strain: Low Risk  (08/13/2023)   Overall Financial Resource Strain (CARDIA)    Difficulty of Paying Living Expenses: Not hard at all  Food Insecurity: No Food Insecurity (08/13/2023)   Hunger Vital Sign    Worried About Running Out of Food in the Last Year: Never true    Ran Out of Food in the Last Year: Never true  Transportation Needs: Unknown (08/13/2023)   PRAPARE - Administrator, Civil Service (Medical): No    Lack of Transportation (Non-Medical): Not on file  Physical Activity: Sufficiently Active (08/13/2023)   Exercise Vital Sign    Days of Exercise per Week: 6 days    Minutes of Exercise per Session: 40 min  Stress: No Stress Concern Present (08/13/2023)   [employer] of Occupational Health - Occupational Stress Questionnaire    Feeling of Stress: Only a little  Social Connections: Socially Integrated (08/13/2023)   Social Connection and Isolation Panel    Frequency of Communication with Friends and Family: Twice a week    Frequency of Social Gatherings with Friends and Family: Once a week    Attends Religious Services: More than 4 times per year    Active Member of [employer] or Organizations: Yes    Attends Banker Meetings: 1 to 4 times per year    Marital Status: Married  Catering manager Violence: Not on file    Review of Systems  Constitutional:  Negative for fatigue, fever and unexpected weight change.  Eyes:  Negative for visual disturbance.  Respiratory:  Negative for cough, chest tightness and shortness of breath.   Cardiovascular:  Negative for chest pain, palpitations and leg swelling.  Gastrointestinal:  Negative for abdominal pain and blood in stool.  Neurological:  Negative for dizziness, light-headedness and headaches.      Objective:   Vitals:   08/14/23 0839  BP: 118/80  Pulse: 60  Temp: 98 F (36.7 C)  TempSrc: Oral  SpO2: 96%  Weight: 229 lb 9.6 oz (104.1 kg)     Physical Exam Vitals reviewed.  Constitutional:      Appearance: He is well-developed.  HENT:     Head: Normocephalic and atraumatic.  Neck:     Vascular: No carotid bruit or JVD.  Cardiovascular:     Rate and Rhythm: Normal rate and regular rhythm.     Heart sounds: Normal heart sounds. No murmur heard. Pulmonary:     Effort: Pulmonary effort is normal.     Breath sounds: Normal breath sounds. No rales.  Musculoskeletal:     Right  lower leg: No edema.     Left lower leg: No edema.  Skin:    General: Skin is warm and dry.  Neurological:     Mental Status: He is alert and oriented to person, place, and time.  Psychiatric:        Mood and Affect: Mood normal.        Assessment & Plan:  Cory Mcfarland is a 48 y.o. male . Prediabetes - Plan: Hemoglobin A1c  - Check A1c, continue diet/exercise approach. Pure hypercholesterolemia - Plan: atorvastatin  (LIPITOR) 40 MG tablet, Lipid panel  Essential hypertension - Plan: valsartan -hydrochlorothiazide  (DIOVAN -HCT) 320-12.5 MG tablet, Comprehensive metabolic panel with GFR  - Stable with current med regimen, check labs, no changes.  Abnormal CBC - Plan: CBC  - Borderline WBC, similar level in the past, asymptomatic, check labs to make sure this is not declining, continue to monitor.  Erectile dysfunction, unspecified erectile dysfunction type - Plan: tadalafil  (CIALIS ) 20 MG tablet  - Stable with intermittent use of Cialis , continue same.  Meds ordered this encounter  Medications   atorvastatin  (LIPITOR) 40 MG tablet    Sig: TAKE 1 TABLET DAILY    Dispense:  90 tablet    Refill:  3   tadalafil  (CIALIS ) 20 MG tablet    Sig: Take 1 tablet (20 mg total) by mouth daily as needed for erectile dysfunction.    Dispense:  10 tablet    Refill:  5    valsartan -hydrochlorothiazide  (DIOVAN -HCT) 320-12.5 MG tablet    Sig: Take 1 tablet by mouth daily.    Dispense:  90 tablet    Refill:  3   Patient Instructions  Thank you for coming in today. No change in medications at this time. If there are any concerns on your bloodwork, I will let you know. Take care!     Signed,   Reyes Pines, MD [facility], [facility] 08/14/23 9:08 AM

## 2023-08-14 NOTE — Patient Instructions (Signed)
 Thank you for coming in today. No change in medications at this time. If there are any concerns on your bloodwork, I will let you know. Take care!

## 2023-08-15 ENCOUNTER — Ambulatory Visit: Payer: Self-pay | Admitting: Family Medicine

## 2023-10-29 ENCOUNTER — Encounter: Payer: Self-pay | Admitting: Dermatology

## 2023-10-29 ENCOUNTER — Ambulatory Visit: Payer: 59 | Admitting: Dermatology

## 2023-10-29 VITALS — BP 117/78

## 2023-10-29 DIAGNOSIS — L821 Other seborrheic keratosis: Secondary | ICD-10-CM

## 2023-10-29 DIAGNOSIS — D1801 Hemangioma of skin and subcutaneous tissue: Secondary | ICD-10-CM | POA: Diagnosis not present

## 2023-10-29 DIAGNOSIS — D229 Melanocytic nevi, unspecified: Secondary | ICD-10-CM

## 2023-10-29 DIAGNOSIS — W908XXA Exposure to other nonionizing radiation, initial encounter: Secondary | ICD-10-CM

## 2023-10-29 DIAGNOSIS — Z1283 Encounter for screening for malignant neoplasm of skin: Secondary | ICD-10-CM | POA: Diagnosis not present

## 2023-10-29 DIAGNOSIS — L814 Other melanin hyperpigmentation: Secondary | ICD-10-CM | POA: Diagnosis not present

## 2023-10-29 DIAGNOSIS — R202 Paresthesia of skin: Secondary | ICD-10-CM

## 2023-10-29 DIAGNOSIS — L578 Other skin changes due to chronic exposure to nonionizing radiation: Secondary | ICD-10-CM

## 2023-10-29 DIAGNOSIS — D485 Neoplasm of uncertain behavior of skin: Secondary | ICD-10-CM | POA: Diagnosis not present

## 2023-10-29 NOTE — Patient Instructions (Addendum)

## 2023-10-29 NOTE — Progress Notes (Signed)
 Follow-Up Visit   Subjective  Cory Mcfarland is a 48 y.o. male who presents for the following: Skin Cancer Screening and Full Body Skin Exam - No history of skin cancer. He is also follow ing up for notalgia paresthetica. He got the compounded neuro relief cream from [facility] and it helps a little bit. It has not been as bad in the past few weeks but was worse over the summer. Itch is about 4/10. He was seen for this 06/02/2023.  The patient presents for Total-Body Skin Exam (TBSE) for skin cancer screening and mole check. The patient has spots, moles and lesions to be evaluated, some may be new or changing and the patient may have concern these could be cancer.    The following portions of the chart were reviewed this encounter and updated as appropriate: medications, allergies, medical history  Review of Systems:  No other skin or systemic complaints except as noted in HPI or Assessment and Plan.  Objective  Well appearing patient in no apparent distress; mood and affect are within normal limits.  A full examination was performed including scalp, head, eyes, ears, nose, lips, neck, chest, axillae, abdomen, back, buttocks, bilateral upper extremities, bilateral lower extremities, hands, feet, fingers, toes, fingernails, and toenails. All findings within normal limits unless otherwise noted below.   Relevant physical exam findings are noted in the Assessment and Plan.  Left Lateral Heel 2 mm brown macule   Assessment & Plan   SKIN CANCER SCREENING PERFORMED TODAY.  ACTINIC DAMAGE - Chronic condition, secondary to cumulative UV/sun exposure - diffuse scaly erythematous macules with underlying dyspigmentation - Recommend daily broad spectrum sunscreen SPF 30+ to sun-exposed areas, reapply every 2 hours as needed.  - Staying in the shade or wearing long sleeves, sun glasses (UVA+UVB protection) and wide brim hats (4-inch brim around the entire circumference of the hat) are also  recommended for sun protection.  - Call for new or changing lesions.  LENTIGINES, SEBORRHEIC KERATOSES, HEMANGIOMAS - Benign normal skin lesions - Benign-appearing - Call for any changes  MELANOCYTIC NEVI - Tan-brown and/or pink-flesh-colored symmetric macules and papules - Benign appearing on exam today - Observation - Call clinic for new or changing moles - Recommend daily use of broad spectrum spf 30+ sunscreen to sun-exposed areas.   NOTALGIA PARESTHETICA Exam: Perispinal hyperpigmented patch  Treatment: Continue TMC 0.1% cream twice daily x 2 weeks as needed for itch.   If the itch is persistent, recommend evaluation with PCP since there is nothing visible on the skin.   NEOPLASM OF UNCERTAIN BEHAVIOR OF SKIN Left Lateral Heel Epidermal / dermal shaving  Lesion diameter (cm):  0.2 Informed consent: discussed and consent obtained   Timeout: patient name, date of birth, surgical site, and procedure verified   Procedure prep:  Patient was prepped and draped in usual sterile fashion Prep type:  Isopropyl alcohol Anesthesia: the lesion was anesthetized in a standard fashion   Anesthetic:  1% lidocaine w/ epinephrine 1-100,000 buffered w/ 8.4% NaHCO3 Instrument used: flexible razor blade   Hemostasis achieved with: pressure, aluminum chloride and electrodesiccation   Outcome: patient tolerated procedure well   Post-procedure details: sterile dressing applied and wound care instructions given   Dressing type: bandage and petrolatum    Specimen 1 - Surgical pathology Differential Diagnosis: R/O dysplastic nevus  Check Margins: No Return in about 1 year (around 10/28/2024) for TBSE.  I, Roseline Hutchinson, CMA, am acting as scribe for [employer], DO .   Documentation:  I have reviewed the above documentation for accuracy and completeness, and I agree with the above.  Delon Lenis, DO

## 2023-11-02 LAB — SURGICAL PATHOLOGY

## 2023-11-09 ENCOUNTER — Ambulatory Visit: Payer: Self-pay | Admitting: Dermatology

## 2024-02-05 ENCOUNTER — Ambulatory Visit (INDEPENDENT_AMBULATORY_CARE_PROVIDER_SITE_OTHER): Admitting: Family Medicine

## 2024-02-05 ENCOUNTER — Encounter: Payer: Self-pay | Admitting: Family Medicine

## 2024-02-05 VITALS — BP 120/70 | HR 59 | Temp 98.3°F | Ht 74.0 in | Wt 237.0 lb

## 2024-02-05 DIAGNOSIS — J40 Bronchitis, not specified as acute or chronic: Secondary | ICD-10-CM

## 2024-02-05 MED ORDER — AZITHROMYCIN 250 MG PO TABS: ORAL_TABLET | ORAL | 0 refills | Status: DC

## 2024-02-05 MED ORDER — ALBUTEROL SULFATE HFA 108 (90 BASE) MCG/ACT IN AERS: 2.0000 | INHALATION_SPRAY | Freq: Four times a day (QID) | RESPIRATORY_TRACT | 0 refills | Status: AC | PRN

## 2024-02-05 MED ORDER — PROMETHAZINE-DM 6.25-15 MG/5ML PO SYRP: 5.0000 mL | ORAL_SOLUTION | Freq: Four times a day (QID) | ORAL | 0 refills | Status: AC | PRN

## 2024-02-05 NOTE — Progress Notes (Signed)
" ° °  Subjective:    Patient ID: Cory Mcfarland, male    DOB: 08/21/1975, 49 y.o.   MRN: [id number]  HPI URI- pt reports cough started Sunday.  Pt reports cough is interfering w/ sleep.  + chest congestion but cough is not productive.  + wheezing.  No fever.  Initially had sinus pressure but this has improved.  No ear pain.  Denies body aches and chills.  + sick contacts.   Review of Systems For ROS see HPI     Objective:   Physical Exam Vitals reviewed.  Constitutional:      General: He is not in acute distress.    Appearance: Normal appearance. He is not ill-appearing.  HENT:     Head: Normocephalic and atraumatic.     Right Ear: Tympanic membrane and ear canal normal.     Left Ear: Tympanic membrane and ear canal normal.     Nose: Congestion present.     Comments: No TTP over frontal or maxillary sinuses    Mouth/Throat:     Pharynx: No oropharyngeal exudate or posterior oropharyngeal erythema.  Eyes:     Extraocular Movements: Extraocular movements intact.     Conjunctiva/sclera: Conjunctivae normal.  Cardiovascular:     Rate and Rhythm: Normal rate and regular rhythm.  Pulmonary:     Effort: Pulmonary effort is normal. No respiratory distress.     Breath sounds: Wheezing (end expiratory wheezes at bases bilaterally) and rhonchi (coarse BS at bases bilaterally) present.     Comments: Deep, wet cough Musculoskeletal:     Cervical back: Neck supple.  Lymphadenopathy:     Cervical: No cervical adenopathy.  Neurological:     General: No focal deficit present.     Mental Status: He is alert and oriented to person, place, and time.  Psychiatric:        Mood and Affect: Mood normal.        Behavior: Behavior normal.        Thought Content: Thought content normal.           Assessment & Plan:  Bronchitis- new.  Pt w/ end expiratory wheezes and coarse BS at bases.  Cough is deep and wet but not productive.  Sounds like a rattle.  Start Zpack.  Albuterol  and cough meds prn.   Reviewed supportive care and red flags that should prompt return.  Pt expressed understanding and is in agreement w/ plan.   "

## 2024-02-05 NOTE — Patient Instructions (Signed)
 Follow up as needed or as scheduled START the Zpack as directed USE the Albuterol  inhaler- 2 puffs every 4 hrs as needed for cough or wheezing ADD the cough syrup at night to help w/ sleep CONTINUE the Mucinex  or Delsym during the day Drink plenty of fluids REST! Call with any questions or concerns Hang in there!

## 2024-02-10 ENCOUNTER — Encounter: Payer: Self-pay | Admitting: Family Medicine

## 2024-02-10 NOTE — Telephone Encounter (Signed)
 I did not see him for this illness but on review of Dr. Charis note he was given Z-Pak, albuterol , Phenergan  cough syrup.  I would advise him that the azithromycin  continues to work for approximately 5 additional days after he stops taking the pills.  If he is improving, he may require just a little bit more time.  Has he used the albuterol ? That may be helpful if he is wheezing, but depending how often he is needing it we could consider other medications.  Again if he is improving I would not recommend anything new for now but will defer to Dr. Mahlon if change in plan.  Thanks

## 2024-02-19 ENCOUNTER — Encounter: Payer: Self-pay | Admitting: Family Medicine

## 2024-02-19 ENCOUNTER — Ambulatory Visit (INDEPENDENT_AMBULATORY_CARE_PROVIDER_SITE_OTHER): Admitting: Family Medicine

## 2024-02-19 VITALS — BP 102/68 | HR 66 | Temp 98.8°F | Resp 9 | Ht 74.0 in | Wt 235.0 lb

## 2024-02-19 DIAGNOSIS — I10 Essential (primary) hypertension: Secondary | ICD-10-CM

## 2024-02-19 DIAGNOSIS — Z130 Encounter for screening for diseases of the blood and blood-forming organs and certain disorders involving the immune mechanism: Secondary | ICD-10-CM

## 2024-02-19 DIAGNOSIS — Z0000 Encounter for general adult medical examination without abnormal findings: Secondary | ICD-10-CM

## 2024-02-19 DIAGNOSIS — N529 Male erectile dysfunction, unspecified: Secondary | ICD-10-CM

## 2024-02-19 DIAGNOSIS — R7303 Prediabetes: Secondary | ICD-10-CM | POA: Diagnosis not present

## 2024-02-19 DIAGNOSIS — E785 Hyperlipidemia, unspecified: Secondary | ICD-10-CM | POA: Diagnosis not present

## 2024-02-19 LAB — CBC
HCT: 41.8 % (ref 39.0–52.0)
Hemoglobin: 13.8 g/dL (ref 13.0–17.0)
MCHC: 33 g/dL (ref 30.0–36.0)
MCV: 79.7 fl (ref 78.0–100.0)
Platelets: 257 K/uL (ref 150.0–400.0)
RBC: 5.24 Mil/uL (ref 4.22–5.81)
RDW: 13.9 % (ref 11.5–15.5)
WBC: 3.8 K/uL — ABNORMAL LOW (ref 4.0–10.5)

## 2024-02-19 LAB — COMPREHENSIVE METABOLIC PANEL WITH GFR
ALT: 46 U/L (ref 3–53)
AST: 27 U/L (ref 5–37)
Albumin: 4.7 g/dL (ref 3.5–5.2)
Alkaline Phosphatase: 45 U/L (ref 39–117)
BUN: 18 mg/dL (ref 6–23)
CO2: 28 meq/L (ref 19–32)
Calcium: 10 mg/dL (ref 8.4–10.5)
Chloride: 104 meq/L (ref 96–112)
Creatinine, Ser: 1.01 mg/dL (ref 0.40–1.50)
GFR: 88.16 mL/min
Glucose, Bld: 104 mg/dL — ABNORMAL HIGH (ref 70–99)
Potassium: 4.4 meq/L (ref 3.5–5.1)
Sodium: 140 meq/L (ref 135–145)
Total Bilirubin: 0.5 mg/dL (ref 0.2–1.2)
Total Protein: 7.4 g/dL (ref 6.0–8.3)

## 2024-02-19 LAB — LIPID PANEL
Cholesterol: 165 mg/dL (ref 28–200)
HDL: 44.4 mg/dL
LDL Cholesterol: 69 mg/dL (ref 10–99)
NonHDL: 121.07
Total CHOL/HDL Ratio: 4
Triglycerides: 261 mg/dL — ABNORMAL HIGH (ref 10.0–149.0)
VLDL: 52.2 mg/dL — ABNORMAL HIGH (ref 0.0–40.0)

## 2024-02-19 LAB — HEMOGLOBIN A1C: Hgb A1c MFr Bld: 5.8 % (ref 4.6–6.5)

## 2024-02-19 MED ORDER — TADALAFIL 20 MG PO TABS: 20.0000 mg | ORAL_TABLET | Freq: Every day | ORAL | 5 refills | Status: DC | PRN

## 2024-02-19 MED ORDER — TADALAFIL 20 MG PO TABS: 20.0000 mg | ORAL_TABLET | Freq: Every day | ORAL | 1 refills | Status: AC | PRN

## 2024-02-19 NOTE — Patient Instructions (Signed)
 Thank you for coming in today. No change in medications at this time. If there are any concerns on your bloodwork, I will let you know. Take care! Preventive Care 49-49 Years Old, Male Preventive care refers to lifestyle choices and visits with your health care provider that can promote health and wellness. Preventive care visits are also called wellness exams. What can I expect for my preventive care visit? Counseling During your preventive care visit, your health care provider may ask about your: Medical history, including: Past medical problems. Family medical history. Current health, including: Emotional well-being. Home life and relationship well-being. Sexual activity. Lifestyle, including: Alcohol, nicotine or tobacco, and drug use. Access to firearms. Diet, exercise, and sleep habits. Safety issues such as seatbelt and bike helmet use. Sunscreen use. Work and work Astronomer. Physical exam Your health care provider will check your: Height and weight. These may be used to calculate your BMI (body mass index). BMI is a measurement that tells if you are at a healthy weight. Waist circumference. This measures the distance around your waistline. This measurement also tells if you are at a healthy weight and may help predict your risk of certain diseases, such as type 2 diabetes and high blood pressure. Heart rate and blood pressure. Body temperature. Skin for abnormal spots. What immunizations do I need?  Vaccines are usually given at various ages, according to a schedule. Your health care provider will recommend vaccines for you based on your age, medical history, and lifestyle or other factors, such as travel or where you work. What tests do I need? Screening Your health care provider may recommend screening tests for certain conditions. This may include: Lipid and cholesterol levels. Diabetes screening. This is done by checking your blood sugar (glucose) after you have not  eaten for a while (fasting). Hepatitis B test. Hepatitis C test. HIV (human immunodeficiency virus) test. STI (sexually transmitted infection) testing, if you are at risk. Lung cancer screening. Prostate cancer screening. Colorectal cancer screening. Talk with your health care provider about your test results, treatment options, and if necessary, the need for more tests. Follow these instructions at home: Eating and drinking  Eat a diet that includes fresh fruits and vegetables, whole grains, lean protein, and low-fat dairy products. Take vitamin and mineral supplements as recommended by your health care provider. Do not drink alcohol if your health care provider tells you not to drink. If you drink alcohol: Limit how much you have to 0-2 drinks a day. Know how much alcohol is in your drink. In the [place] one drink equals one 12 oz bottle of beer (355 mL), one 5 oz glass of wine (148 mL), or one 1 oz glass of hard liquor (44 mL). Lifestyle Brush your teeth every morning and night with fluoride toothpaste. Floss one time each day. Exercise for at least 30 minutes 5 or more days each week. Do not use any products that contain nicotine or tobacco. These products include cigarettes, chewing tobacco, and vaping devices, such as e-cigarettes. If you need help quitting, ask your health care provider. Do not use drugs. If you are sexually active, practice safe sex. Use a condom or other form of protection to prevent STIs. Take aspirin  only as told by your health care provider. Make sure that you understand how much to take and what form to take. Work with your health care provider to find out whether it is safe and beneficial for you to take aspirin  daily. Find healthy ways to manage  stress, such as: Meditation, yoga, or listening to music. Journaling. Talking to a trusted person. Spending time with friends and family. Minimize exposure to UV radiation to reduce your risk of skin  cancer. Safety Always wear your seat belt while driving or riding in a vehicle. Do not drive: If you have been drinking alcohol. Do not ride with someone who has been drinking. When you are tired or distracted. While texting. If you have been using any mind-altering substances or drugs. Wear a helmet and other protective equipment during sports activities. If you have firearms in your house, make sure you follow all gun safety procedures. What's next? Go to your health care provider once a year for an annual wellness visit. Ask your health care provider how often you should have your eyes and teeth checked. Stay up to date on all vaccines. This information is not intended to replace advice given to you by your health care provider. Make sure you discuss any questions you have with your health care provider. Document Revised: 07/11/2020 Document Reviewed: 07/11/2020 Elsevier Patient Education  2024 [employer].

## 2024-02-19 NOTE — Telephone Encounter (Signed)
 Patient is wanting to switch rx for Cialis  to 90 day supply for mail order. Okay to switch?

## 2024-02-19 NOTE — Progress Notes (Signed)
 "  Subjective:  Patient ID: Cory Mcfarland, male    DOB: 1975/05/04  Age: 49 y.o. MRN: [id number]  CC:  Chief Complaint  Patient presents with   Annual Exam    No questions or concerns.     HPI Cory Mcfarland presents for Annual Exam PCP: [facility], Dr. Alm, cherry angioma, seborrheic keratoses, melanocytic nevi.  Appointment in October.  Benign biopsies.  History of nostalgia paresthetica. No current tx - no prior relief with topical tx - tolerable.  Cardiology, Dr. Delford. Stress test 09/2022, negative stress echo.   He was seen January 9 with bronchitis by my colleague.Treated with albuterol , azithromycin , promethazine  cough syrup. Improved.   Hypertension: Diovan  HCT 320/12.5 mg daily.  No new side effects. Home readings: 120 range.  BP Readings from Last 3 Encounters:  02/19/24 102/68  02/05/24 120/70  10/29/23 117/78   Lab Results  Component Value Date   CREATININE 0.96 08/14/2023   Hyperlipidemia: Lipitor 40 mg daily without new myalgias. Lab Results  Component Value Date   CHOL 171 08/14/2023   HDL 47.90 08/14/2023   LDLCALC 83 08/14/2023   TRIG 202.0 (H) 08/14/2023   CHOLHDL 4 08/14/2023   Lab Results  Component Value Date   ALT 42 08/14/2023   AST 30 08/14/2023   ALKPHOS 45 08/14/2023   BILITOT 0.6 08/14/2023   Prediabetes: Diet/exercise approach. Working on diet changes. Daily workouts for 54min+, working from home can be more difficult for diet control.  Lab Results  Component Value Date   HGBA1C 6.1 08/14/2023   Wt Readings from Last 3 Encounters:  02/19/24 235 lb (106.6 kg)  02/05/24 237 lb (107.5 kg)  08/14/23 229 lb 9.6 oz (104.1 kg)    Erectile dysfunction Treated with Cialis  20 mg, intermittent dosing, effective with current dose usually. Denies new headache, flushing, vision or hearing changes or chest pain/dyspnea with exertion.       02/19/2024    8:07 AM 02/05/2024    9:02 AM 02/13/2023    8:37 AM 02/12/2022    8:04 AM 03/14/2020     1:49 PM  Depression screen PHQ 2/9  Decreased Interest 0 0 0 0 0  Down, Depressed, Hopeless 0 0 0 0 0  PHQ - 2 Score 0 0 0 0 0  Altered sleeping 0 1 0 0   Tired, decreased energy 0 1 0 0   Change in appetite 0 0 0 0   Feeling bad or failure about yourself  0 0 0 0   Trouble concentrating 0 0 0 0   Moving slowly or fidgety/restless 0 0 0 0   Suicidal thoughts 0 0 0 0   PHQ-9 Score 0 2 0  0    Difficult doing work/chores Not difficult at all         Data saved with a previous flowsheet row definition    Health Maintenance  Topic Date Due   Hepatitis B Vaccines 19-59 Average Risk (1 of 3 - 19+ 3-dose series) Never done   COVID-19 Vaccine (4 - 2025-26 season) 03/06/2024 (Originally 09/28/2023)   Influenza Vaccine  04/26/2024 (Originally 08/28/2023)   HIV Screening  02/18/2025 (Originally 12/22/1990)   DTaP/Tdap/Td (2 - Td or Tdap) 09/26/2026   Colonoscopy  07/09/2027   HPV VACCINES (No Doses Required) Completed   Pneumococcal Vaccine  Aged Out   Meningococcal B Vaccine  Aged Out   Hepatitis C Screening  Discontinued  Colonoscopy in June 2024 with plan for repeat in 5  years due to polyps Prostate: does not have family history of prostate cancer The natural history of prostate cancer and ongoing controversy regarding screening and potential treatment outcomes of prostate cancer has been discussed with the patient. The meaning of a false positive PSA and a false negative PSA has been discussed. He indicates understanding of the limitations of this screening test and wishes to defer to next year PSA testing. Lab Results  Component Value Date   PSA1 0.7 03/14/2020   PSA 0.48 02/13/2023   PSA 0.61 02/12/2022    Immunization History  Administered Date(s) Administered   Influenza, Seasonal, Injecte, Preservative Fre 11/28/2015, 02/13/2023   Influenza,inj,Quad PF,6+ Mos 01/05/2018   Influenza-Unspecified 11/28/2015   PFIZER(Purple Top)SARS-COV-2 Vaccination 04/21/2019, 05/19/2019,  12/24/2019   Tdap 09/25/2016  Flu vaccine - declines with recent illness, which he suspects was flu.   No results found. Optho yearly, no vision changes   Dental: every 6 months.   Alcohol: 6 or less per week.   Tobacco: none  Exercise: or more 7 days, cardio and resistance.    History Patient Active Problem List   Diagnosis Date Noted   Essential hypertension 03/14/2020   High cholesterol 12/24/2016   Past Medical History:  Diagnosis Date   Anemia    Ankle sprain    Hyperlipidemia    Hypertension    Past Surgical History:  Procedure Laterality Date   COLONOSCOPY     hemorroid removal     8 years   HERNIA REPAIR     Allergies[1] Prior to Admission medications  Medication Sig Start Date End Date Taking? Authorizing Provider  albuterol  (VENTOLIN  HFA) 108 (90 Base) MCG/ACT inhaler Inhale 2 puffs into the lungs every 6 (six) hours as needed for wheezing or shortness of breath. 02/05/24  Yes Tabori, Katherine E, MD  atorvastatin  (LIPITOR) 40 MG tablet TAKE 1 TABLET DAILY 08/14/23  Yes Levora Reyes SAUNDERS, MD  Multiple Vitamin (MULTIVITAMIN) tablet Take 1 tablet by mouth daily.   Yes [provider]  tadalafil  (CIALIS ) 20 MG tablet Take 1 tablet (20 mg total) by mouth daily as needed for erectile dysfunction. 08/14/23  Yes Levora Reyes SAUNDERS, MD  valsartan -hydrochlorothiazide  (DIOVAN -HCT) 320-12.5 MG tablet Take 1 tablet by mouth daily. 08/14/23  Yes Levora Reyes SAUNDERS, MD  azithromycin  (ZITHROMAX ) 250 MG tablet 2 tabs on day 1, 1 tab on day 2-5 Patient not taking: Reported on 02/19/2024 02/05/24   Tabori, Katherine E, MD  hydroquinone  4 % cream Apply topically at bedtime. Patient not taking: Reported on 02/19/2024 10/30/22   Alm Delon SAILOR, DO  promethazine -dextromethorphan (PROMETHAZINE -DM) 6.25-15 MG/5ML syrup Take 5 mLs by mouth 4 (four) times daily as needed. Patient not taking: Reported on 02/19/2024 02/05/24   Tabori, Katherine E, MD  Safety Seal Miscellaneous MISC  Post Neuralgia cream gabapentin USP 4% and Lidocaine USP 2% - use on affected area of left side twice a day for 6 weeks then stop. Patient not taking: Reported on 02/19/2024 06/02/23   Alm Delon SAILOR, DO   Social History   Socioeconomic History   Marital status: Married    Spouse name: Not on file   Number of children: Not on file   Years of education: Not on file   Highest education level: Master's degree (e.g., MA, MS, MEng, MEd, MSW, MBA)  Occupational History   Not on file  Tobacco Use   Smoking status: Former    Current packs/day: 0.00    Types: Cigarettes  Quit date: 01/28/2003    Years since quitting: 21.0    Passive exposure: Never   Smokeless tobacco: Never   Tobacco comments:    Quit 2003 ~  Vaping Use   Vaping status: Never Used  Substance and Sexual Activity   Alcohol use: Yes    Alcohol/week: 6.0 standard drinks of alcohol    Types: 6 Standard drinks or equivalent per week   Drug use: Not Currently    Types: Marijuana    Comment: on occasion with friends in legal states   Sexual activity: Yes    Birth control/protection: Surgical  Other Topics Concern   Not on file  Social History Narrative   Not on file   Social Drivers of Health   Tobacco Use: Medium Risk (02/19/2024)   Patient History    Smoking Tobacco Use: Former    Smokeless Tobacco Use: Never    Passive Exposure: Never  Physicist, Medical Strain: Low Risk (02/18/2024)   Overall Financial Resource Strain (CARDIA)    Difficulty of Paying Living Expenses: Not hard at all  Food Insecurity: No Food Insecurity (02/18/2024)   Epic    Worried About Radiation Protection Practitioner of Food in the Last Year: Never true    Ran Out of Food in the Last Year: Never true  Transportation Needs: No Transportation Needs (02/18/2024)   Epic    Lack of Transportation (Medical): No    Lack of Transportation (Non-Medical): No  Physical Activity: Sufficiently Active (02/18/2024)   Exercise Vital Sign    Days of Exercise per Week: 7 days     Minutes of Exercise per Session: 40 min  Stress: No Stress Concern Present (02/18/2024)   [employer] of Occupational Health - Occupational Stress Questionnaire    Feeling of Stress: Only a little  Social Connections: Socially Integrated (02/18/2024)   Social Connection and Isolation Panel    Frequency of Communication with Friends and Family: More than three times a week    Frequency of Social Gatherings with Friends and Family: Once a week    Attends Religious Services: More than 4 times per year    Active Member of Clubs or Organizations: Yes    Attends Banker Meetings: More than 4 times per year    Marital Status: Married  Catering Manager Violence: Not on file  Depression (PHQ2-9): Low Risk (02/19/2024)   Depression (PHQ2-9)    PHQ-2 Score: 0  Alcohol Screen: Low Risk (02/18/2024)   Alcohol Screen    Last Alcohol Screening Score (AUDIT): 4  Housing: Low Risk (02/18/2024)   Epic    Unable to Pay for Housing in the Last Year: No    Number of Times Moved in the Last Year: 0    Homeless in the Last Year: No  Utilities: Not on file  Health Literacy: Not on file    Review of Systems 13 point review of systems per patient health survey noted.  Negative other than as indicated above or in HPI.    Objective:   Vitals:   02/19/24 0804  BP: 102/68  Pulse: 66  Resp: (!) 9  Temp: 98.8 F (37.1 C)  TempSrc: Temporal  SpO2: 97%  Weight: 235 lb (106.6 kg)  Height: 6' 2 (1.88 m)     Physical Exam Vitals reviewed.  Constitutional:      Appearance: He is well-developed.  HENT:     Head: Normocephalic and atraumatic.     Right Ear: External ear normal.  Left Ear: External ear normal.  Eyes:     Conjunctiva/sclera: Conjunctivae normal.     Pupils: Pupils are equal, round, and reactive to light.  Neck:     Thyroid: No thyromegaly.  Cardiovascular:     Rate and Rhythm: Normal rate and regular rhythm.     Heart sounds: Normal heart sounds.   Pulmonary:     Effort: Pulmonary effort is normal. No respiratory distress.     Breath sounds: Normal breath sounds. No wheezing.  Abdominal:     General: There is no distension.     Palpations: Abdomen is soft.     Tenderness: There is no abdominal tenderness.  Musculoskeletal:        General: No tenderness. Normal range of motion.     Cervical back: Normal range of motion and neck supple.  Lymphadenopathy:     Cervical: No cervical adenopathy.  Skin:    General: Skin is warm and dry.  Neurological:     Mental Status: He is alert and oriented to person, place, and time.     Deep Tendon Reflexes: Reflexes are normal and symmetric.  Psychiatric:        Behavior: Behavior normal.        Assessment & Plan:  Cory Mcfarland is a 49 y.o. male . Annual physical exam  Erectile dysfunction, unspecified erectile dysfunction type - Plan: tadalafil  (CIALIS ) 20 MG tablet  Essential hypertension - Plan: Comprehensive metabolic panel with GFR, CBC  Prediabetes - Plan: Hemoglobin A1c  Hyperlipidemia, unspecified hyperlipidemia type - Plan: Lipid panel  Screening for deficiency anemia - Plan: CBC  -anticipatory guidance as below in AVS, screening labs above. Health maintenance items as above in HPI discussed/recommended as applicable.  Hypertension stable, commended on exercise, plans on further diet changes but suspect some weight could be due to muscle mass with increased resistance/weight exercise.  Check labs above and adjust plan accordingly.  Cialis  refilled, other meds have refills.  95-month follow-up.   Meds ordered this encounter  Medications   tadalafil  (CIALIS ) 20 MG tablet    Sig: Take 1 tablet (20 mg total) by mouth daily as needed for erectile dysfunction.    Dispense:  10 tablet    Refill:  5   Patient Instructions  Thank you for coming in today. No change in medications at this time. If there are any concerns on your bloodwork, I will let you know. Take  care! Preventive Care 87-5 Years Old, Male Preventive care refers to lifestyle choices and visits with your health care provider that can promote health and wellness. Preventive care visits are also called wellness exams. What can I expect for my preventive care visit? Counseling During your preventive care visit, your health care provider may ask about your: Medical history, including: Past medical problems. Family medical history. Current health, including: Emotional well-being. Home life and relationship well-being. Sexual activity. Lifestyle, including: Alcohol, nicotine or tobacco, and drug use. Access to firearms. Diet, exercise, and sleep habits. Safety issues such as seatbelt and bike helmet use. Sunscreen use. Work and work astronomer. Physical exam Your health care provider will check your: Height and weight. These may be used to calculate your BMI (body mass index). BMI is a measurement that tells if you are at a healthy weight. Waist circumference. This measures the distance around your waistline. This measurement also tells if you are at a healthy weight and may help predict your risk of certain diseases, such as type 2 diabetes and high  blood pressure. Heart rate and blood pressure. Body temperature. Skin for abnormal spots. What immunizations do I need?  Vaccines are usually given at various ages, according to a schedule. Your health care provider will recommend vaccines for you based on your age, medical history, and lifestyle or other factors, such as travel or where you work. What tests do I need? Screening Your health care provider may recommend screening tests for certain conditions. This may include: Lipid and cholesterol levels. Diabetes screening. This is done by checking your blood sugar (glucose) after you have not eaten for a while (fasting). Hepatitis B test. Hepatitis C test. HIV (human immunodeficiency virus) test. STI (sexually transmitted  infection) testing, if you are at risk. Lung cancer screening. Prostate cancer screening. Colorectal cancer screening. Talk with your health care provider about your test results, treatment options, and if necessary, the need for more tests. Follow these instructions at home: Eating and drinking  Eat a diet that includes fresh fruits and vegetables, whole grains, lean protein, and low-fat dairy products. Take vitamin and mineral supplements as recommended by your health care provider. Do not drink alcohol if your health care provider tells you not to drink. If you drink alcohol: Limit how much you have to 0-2 drinks a day. Know how much alcohol is in your drink. In the [place] one drink equals one 12 oz bottle of beer (355 mL), one 5 oz glass of wine (148 mL), or one 1 oz glass of hard liquor (44 mL). Lifestyle Brush your teeth every morning and night with fluoride toothpaste. Floss one time each day. Exercise for at least 30 minutes 5 or more days each week. Do not use any products that contain nicotine or tobacco. These products include cigarettes, chewing tobacco, and vaping devices, such as e-cigarettes. If you need help quitting, ask your health care provider. Do not use drugs. If you are sexually active, practice safe sex. Use a condom or other form of protection to prevent STIs. Take aspirin only as told by your health care provider. Make sure that you understand how much to take and what form to take. Work with your health care provider to find out whether it is safe and beneficial for you to take aspirin daily. Find healthy ways to manage stress, such as: Meditation, yoga, or listening to music. Journaling. Talking to a trusted person. Spending time with friends and family. Minimize exposure to UV radiation to reduce your risk of skin cancer. Safety Always wear your seat belt while driving or riding in a vehicle. Do not drive: If you have been drinking alcohol. Do not ride with  someone who has been drinking. When you are tired or distracted. While texting. If you have been using any mind-altering substances or drugs. Wear a helmet and other protective equipment during sports activities. If you have firearms in your house, make sure you follow all gun safety procedures. What's next? Go to your health care provider once a year for an annual wellness visit. Ask your health care provider how often you should have your eyes and teeth checked. Stay up to date on all vaccines. This information is not intended to replace advice given to you by your health care provider. Make sure you discuss any questions you have with your health care provider. Document Revised: 07/11/2020 Document Reviewed: 07/11/2020 Elsevier Patient Education  2024 [employer]    Signed,   Reyes Pines, MD [facility] 02/19/24 8:41 AM        [  1] No Known Allergies  "

## 2024-02-22 ENCOUNTER — Ambulatory Visit: Payer: Self-pay | Admitting: Family Medicine

## 2024-03-01 NOTE — Progress Notes (Signed)
 Last read by Cordella Barcelona at 12:43PM on 03/01/2024.

## 2024-08-18 ENCOUNTER — Ambulatory Visit: Admitting: Family Medicine

## 2024-10-31 ENCOUNTER — Ambulatory Visit: Admitting: Dermatology
